# Patient Record
Sex: Male | Born: 1965 | Race: White | Hispanic: No | Marital: Married | Smoking: Never smoker
Health system: Southern US, Community
[De-identification: ages and names within clinical notes are randomized; demographics above are authoritative.]

## PROBLEM LIST (undated history)

## (undated) DIAGNOSIS — S42302A Unspecified fracture of shaft of humerus, left arm, initial encounter for closed fracture: Secondary | ICD-10-CM

## (undated) DIAGNOSIS — R569 Unspecified convulsions: Secondary | ICD-10-CM

## (undated) DIAGNOSIS — Z87898 Personal history of other specified conditions: Secondary | ICD-10-CM

## (undated) DIAGNOSIS — M5126 Other intervertebral disc displacement, lumbar region: Secondary | ICD-10-CM

## (undated) DIAGNOSIS — C801 Malignant (primary) neoplasm, unspecified: Secondary | ICD-10-CM

## (undated) DIAGNOSIS — R51 Headache: Secondary | ICD-10-CM

## (undated) DIAGNOSIS — G2581 Restless legs syndrome: Secondary | ICD-10-CM

## (undated) DIAGNOSIS — I10 Essential (primary) hypertension: Secondary | ICD-10-CM

## (undated) DIAGNOSIS — Z87442 Personal history of urinary calculi: Secondary | ICD-10-CM

## (undated) HISTORY — DX: Essential (primary) hypertension: I10

## (undated) HISTORY — DX: Unspecified convulsions: R56.9

## (undated) HISTORY — DX: Personal history of urinary calculi: Z87.442

## (undated) HISTORY — DX: Personal history of other specified conditions: Z87.898

## (undated) HISTORY — DX: Headache: R51

## (undated) HISTORY — DX: Restless legs syndrome: G25.81

## (undated) HISTORY — PX: SHOULDER SURGERY: SHX246

## (undated) HISTORY — DX: Other intervertebral disc displacement, lumbar region: M51.26

## (undated) HISTORY — PX: FRACTURE SURGERY: SHX138

## (undated) HISTORY — DX: Unspecified fracture of shaft of humerus, left arm, initial encounter for closed fracture: S42.302A

## (undated) HISTORY — DX: Malignant (primary) neoplasm, unspecified: C80.1

---

## 1988-06-24 DIAGNOSIS — C801 Malignant (primary) neoplasm, unspecified: Secondary | ICD-10-CM

## 1988-06-24 HISTORY — DX: Malignant (primary) neoplasm, unspecified: C80.1

## 2004-04-10 ENCOUNTER — Ambulatory Visit: Payer: Self-pay

## 2004-04-10 ENCOUNTER — Ambulatory Visit: Payer: Self-pay | Admitting: Cardiology

## 2004-05-08 ENCOUNTER — Ambulatory Visit: Payer: Self-pay | Admitting: Cardiology

## 2011-11-11 DIAGNOSIS — I10 Essential (primary) hypertension: Secondary | ICD-10-CM | POA: Insufficient documentation

## 2011-11-11 DIAGNOSIS — G40309 Generalized idiopathic epilepsy and epileptic syndromes, not intractable, without status epilepticus: Secondary | ICD-10-CM | POA: Insufficient documentation

## 2011-11-11 DIAGNOSIS — G43019 Migraine without aura, intractable, without status migrainosus: Secondary | ICD-10-CM | POA: Insufficient documentation

## 2011-11-11 DIAGNOSIS — Z85820 Personal history of malignant melanoma of skin: Secondary | ICD-10-CM | POA: Insufficient documentation

## 2011-11-11 DIAGNOSIS — Z5181 Encounter for therapeutic drug level monitoring: Secondary | ICD-10-CM | POA: Insufficient documentation

## 2012-04-05 ENCOUNTER — Ambulatory Visit: Payer: Self-pay | Admitting: Family Medicine

## 2012-04-05 VITALS — BP 135/92 | HR 57 | Temp 98.1°F | Resp 16 | Ht 65.5 in | Wt 172.4 lb

## 2012-04-05 DIAGNOSIS — Z0289 Encounter for other administrative examinations: Secondary | ICD-10-CM
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
## 2012-09-20 ENCOUNTER — Telehealth: Payer: Self-pay

## 2012-09-20 MED ORDER — PHENYTOIN SODIUM EXTENDED 100 MG PO CAPS: 300.0000 mg | ORAL_CAPSULE | Freq: Every evening | ORAL | Status: DC

## 2012-09-20 NOTE — Telephone Encounter (Signed)
Spouse called and left message sayinginsurance has changed and they need rx sent to pharmacy.  She left no pharmacy name or medication name.  I called back.  Spoke with spouse.  She said she wants generic medication sent to [facility] with her ID # [id number].  Says she is the primary card holder and the name is under [employer].  Although we have documented that we have sent the previous 3 rx's for generic, she says the pharmacy sent Brand Name last time and they do not want brand name.  States she wants this rx sent now.  Advised her I am sending Rx.

## 2012-12-09 ENCOUNTER — Encounter (HOSPITAL_COMMUNITY): Payer: Self-pay | Admitting: Emergency Medicine

## 2012-12-09 ENCOUNTER — Emergency Department (HOSPITAL_COMMUNITY): Payer: 59

## 2012-12-09 ENCOUNTER — Inpatient Hospital Stay (HOSPITAL_COMMUNITY)
Admission: EM | Admit: 2012-12-09 | Discharge: 2012-12-11 | DRG: 101 | Disposition: A | Discharge status: Home / Self Care | Payer: 59 | Attending: Internal Medicine | Admitting: Internal Medicine

## 2012-12-09 DIAGNOSIS — G40909 Epilepsy, unspecified, not intractable, without status epilepticus: Principal | ICD-10-CM | POA: Diagnosis present

## 2012-12-09 DIAGNOSIS — R55 Syncope and collapse: Secondary | ICD-10-CM | POA: Diagnosis present

## 2012-12-09 DIAGNOSIS — R569 Unspecified convulsions: Secondary | ICD-10-CM | POA: Diagnosis present

## 2012-12-09 DIAGNOSIS — I10 Essential (primary) hypertension: Secondary | ICD-10-CM | POA: Diagnosis present

## 2012-12-09 LAB — CBC WITH DIFFERENTIAL/PLATELET
Basophils Absolute: 0 10*3/uL (ref 0.0–0.1)
Basophils Relative: 0 % (ref 0–1)
Eosinophils Absolute: 0.1 10*3/uL (ref 0.0–0.7)
HCT: 35.3 % — ABNORMAL LOW (ref 39.0–52.0)
Hemoglobin: 12.5 g/dL — ABNORMAL LOW (ref 13.0–17.0)
MCH: 29.8 pg (ref 26.0–34.0)
MCHC: 35.4 g/dL (ref 30.0–36.0)
Monocytes Absolute: 0.7 10*3/uL (ref 0.1–1.0)
Monocytes Relative: 7 % (ref 3–12)
RDW: 12.8 % (ref 11.5–15.5)

## 2012-12-09 LAB — URINALYSIS, ROUTINE W REFLEX MICROSCOPIC
Glucose, UA: 100 mg/dL — AB
Hgb urine dipstick: NEGATIVE
Leukocytes, UA: NEGATIVE
Specific Gravity, Urine: 1.013 (ref 1.005–1.030)
pH: 6 (ref 5.0–8.0)

## 2012-12-09 LAB — PHENYTOIN LEVEL, TOTAL: Phenytoin Lvl: <2.5 ug/mL — ABNORMAL LOW (ref 10.0–20.0)

## 2012-12-09 LAB — TROPONIN I
Troponin I: <0.3 ng/mL (ref <0.30)
Troponin I: <0.3 ng/mL (ref <0.30)

## 2012-12-09 LAB — MRSA PCR SCREENING: MRSA by PCR: NEGATIVE

## 2012-12-09 LAB — POCT I-STAT TROPONIN I: Troponin i, poc: 0.01 ng/mL (ref 0.00–0.08)

## 2012-12-09 MED ORDER — ONDANSETRON HCL 4 MG/2ML IJ SOLN
4.0000 mg | Freq: Four times a day (QID) | INTRAMUSCULAR | Status: DC | PRN
  Administered 2012-12-09 – 2012-12-10 (×3): 4 mg via INTRAVENOUS
  Filled 2012-12-09 (×5): qty 2

## 2012-12-09 MED ORDER — LORAZEPAM 2 MG/ML IJ SOLN
INTRAMUSCULAR | Status: AC
  Administered 2012-12-09: 11:00:00
  Filled 2012-12-09: qty 1

## 2012-12-09 MED ORDER — SODIUM CHLORIDE 0.9 % IV SOLN
1000.0000 mg | Freq: Once | INTRAVENOUS | Status: AC
  Administered 2012-12-09: 1000 mg via INTRAVENOUS
  Filled 2012-12-09: qty 20

## 2012-12-09 MED ORDER — MORPHINE SULFATE 4 MG/ML IJ SOLN
4.0000 mg | Freq: Once | INTRAMUSCULAR | Status: AC
  Administered 2012-12-09: 4 mg via INTRAVENOUS
  Filled 2012-12-09: qty 1

## 2012-12-09 MED ORDER — HYDROMORPHONE HCL PF 1 MG/ML IJ SOLN
1.0000 mg | INTRAMUSCULAR | Status: DC | PRN
  Administered 2012-12-09 – 2012-12-10 (×2): 1 mg via INTRAVENOUS
  Filled 2012-12-09 (×2): qty 1

## 2012-12-09 MED ORDER — ONDANSETRON HCL 4 MG/2ML IJ SOLN
4.0000 mg | Freq: Three times a day (TID) | INTRAMUSCULAR | Status: DC | PRN
  Administered 2012-12-09: 4 mg via INTRAVENOUS
  Filled 2012-12-09: qty 2

## 2012-12-09 MED ORDER — ENOXAPARIN SODIUM 40 MG/0.4ML ~~LOC~~ SOLN
40.0000 mg | Freq: Every morning | SUBCUTANEOUS | Status: DC
  Administered 2012-12-10 – 2012-12-11 (×2): 40 mg via SUBCUTANEOUS
  Filled 2012-12-09 (×3): qty 0.4

## 2012-12-09 MED ORDER — SODIUM CHLORIDE 0.9 % IJ SOLN
3.0000 mL | Freq: Two times a day (BID) | INTRAMUSCULAR | Status: DC
  Administered 2012-12-10 – 2012-12-11 (×2): 3 mL via INTRAVENOUS

## 2012-12-09 MED ORDER — SODIUM CHLORIDE 0.9 % IV SOLN: INTRAVENOUS | Status: DC

## 2012-12-09 MED ORDER — SODIUM CHLORIDE 0.9 % IV SOLN
INTRAVENOUS | Status: DC
  Administered 2012-12-09: 11:00:00 via INTRAVENOUS

## 2012-12-09 MED ORDER — ONDANSETRON HCL 4 MG/2ML IJ SOLN
4.0000 mg | Freq: Once | INTRAMUSCULAR | Status: AC
  Administered 2012-12-09: 4 mg via INTRAVENOUS
  Filled 2012-12-09: qty 2

## 2012-12-09 MED ORDER — SODIUM CHLORIDE 0.9 % IV BOLUS (SEPSIS)
1000.0000 mL | Freq: Once | INTRAVENOUS | Status: AC
  Administered 2012-12-09: 1000 mL via INTRAVENOUS

## 2012-12-09 MED ORDER — BIOTENE DRY MOUTH MT LIQD
15.0000 mL | Freq: Two times a day (BID) | OROMUCOSAL | Status: DC
  Administered 2012-12-09 – 2012-12-11 (×4): 15 mL via OROMUCOSAL

## 2012-12-09 MED ORDER — ACETAMINOPHEN 325 MG PO TABS
650.0000 mg | ORAL_TABLET | Freq: Four times a day (QID) | ORAL | Status: DC | PRN
  Administered 2012-12-09 – 2012-12-11 (×3): 650 mg via ORAL
  Filled 2012-12-09 (×4): qty 2

## 2012-12-09 MED ORDER — CHLORHEXIDINE GLUCONATE 0.12 % MT SOLN
15.0000 mL | Freq: Two times a day (BID) | OROMUCOSAL | Status: DC
  Administered 2012-12-11: 15 mL via OROMUCOSAL
  Filled 2012-12-09 (×6): qty 15

## 2012-12-09 MED ORDER — HYDROMORPHONE HCL PF 1 MG/ML IJ SOLN
1.0000 mg | INTRAMUSCULAR | Status: DC | PRN
  Administered 2012-12-09: 1 mg via INTRAVENOUS
  Filled 2012-12-09: qty 1

## 2012-12-09 MED ORDER — SODIUM CHLORIDE 0.9 % IV SOLN
INTRAVENOUS | Status: DC
  Administered 2012-12-10: 75 mL/h via INTRAVENOUS

## 2012-12-09 MED ORDER — LORAZEPAM 2 MG/ML IJ SOLN
2.0000 mg | Freq: Once | INTRAMUSCULAR | Status: AC
  Administered 2012-12-09: 2 mg via INTRAVENOUS

## 2012-12-09 MED ORDER — MAGIC MOUTHWASH W/LIDOCAINE
5.0000 mL | Freq: Four times a day (QID) | ORAL | Status: DC | PRN
  Administered 2012-12-09: 5 mL via ORAL
  Filled 2012-12-09: qty 5

## 2012-12-09 NOTE — ED Provider Notes (Signed)
History    CSN: [id number] Arrival date & time 12/09/12  4098  First MD Initiated Contact with Patient 12/09/12 [phone]     No chief complaint on file.  (Consider location/radiation/quality/duration/timing/severity/associated sxs/prior Treatment) HPI  47 year old male with prior history of seizure, remote hx of melanoma of the skin presents for evaluations of syncope.  History obtained through wife who is at bedside, through pt and through paramedic.  Patient reports this morning when he was cleaning out his microwave he had an apparent syncopal episode.  His wife heard a "thud" then immediately saw pt laying on the ground, lips are blue, unresponsive with widen pupils, no convulsion.  She was unable to arouse him initially, proceed to perform chest compression for several minutes before he became responsive.  Pt was confused afterward.  Pt currently c/o of chest pain and L arm pain.  Pain is sharp, worsening with movement or with taking deep breath.  Reports nausea without vomiting.  No CP prior to fall.  No precipitating sxs that pt can recall. However, he did report forgetting to take his regular medication yesterday (which is unusual for him) and did not have time to eat dinner last night. Pt works 2 different jobs. Did report feeling tired this AM.  Pt has not had a seizure for nearly 20 years.  Has been taking dilantin regularly.  No recent sickness.  No c/o fever, headache, vision changes, dizziness, lightheadedness, tongue biting, sob, abd pain, back pain, urinary/bowel incontinence, numbness or rash.  Not on blood thinner. Aside from high BP, no significant cardiac disease.  Non smoker, no family hx of premature cardiac death.  Past Medical History  Diagnosis Date  . Cancer february 1990    melanoma stage 3 left shoulder blade  . Seizures    Past Surgical History  Procedure Laterality Date  . Fracture surgery     No family history on file. History  Substance Use Topics  . Smoking  status: Never Smoker   . Smokeless tobacco: Not on file  . Alcohol Use: Yes     Comment: 2 beers year    Review of Systems  All other systems reviewed and are negative.    Allergies  Review of patient's allergies indicates no known allergies.  Home Medications   Current Outpatient Rx  Name  Route  Sig  Dispense  Refill  . bisoprolol-hydrochlorothiazide (ZIAC) 10-6.25 MG per tablet   Oral   Take 1 tablet by mouth daily.         . Calcium Carbonate-Vitamin D (CALCIUM + D PO)   Oral   Take 1 tablet by mouth daily.         Marland Kitchen dexlansoprazole (DEXILANT) 60 MG capsule   Oral   Take 60 mg by mouth daily.         . Multiple Vitamins-Minerals (MULTIVITAMIN PO)   Oral   Take 1 tablet by mouth daily.         . phenytoin (DILANTIN) 100 MG ER capsule   Oral   Take 3 capsules (300 mg total) by mouth every evening.   270 capsule   0     Please Schedule Appt (Primary Cardholder Name Yisroel Ramming ...    There were no vitals taken for this visit. Physical Exam  Nursing note and vitals reviewed. Constitutional: He is oriented to person, place, and time. He appears well-developed and well-nourished. No distress (drowsy but able to answer all questions appropriately).  Awake, alert, nontoxic appearance  HENT:  Head: Normocephalic and atraumatic.  No scalp tenderness  No tongue injury.  An ulcer noted to lateral aspect of tongue  Eyes: Conjunctivae and EOM are normal. Pupils are equal, round, and reactive to light. Right eye exhibits no discharge. Left eye exhibits no discharge.  Neck: Normal range of motion. Neck supple.  Cardiovascular: Normal rate and regular rhythm.   Pulmonary/Chest: Effort normal. No respiratory distress. He exhibits no tenderness (moderate tenderness to L upper chest wall on palpation without crepitus, flail chest, or bruising).  Abdominal: Soft. There is no tenderness. There is no rebound.  Musculoskeletal: He exhibits no edema and no tenderness.  ROM  appears intact, no obvious focal weakness  Neurological: He is alert and oriented to person, place, and time. He has normal strength. No cranial nerve deficit or sensory deficit. Coordination normal. GCS eye subscore is 4. GCS verbal subscore is 5. GCS motor subscore is 6.  Skin: Skin is warm and dry. No rash noted.  Psychiatric: He has a normal mood and affect.    ED Course  Procedures (including critical care time)   Date: 12/09/2012  Rate: 78  Rhythm: normal sinus rhythm  QRS Axis: normal  Intervals: normal  ST/T Wave abnormalities: normal  Conduction Disutrbances: none  Narrative Interpretation:   Old EKG Reviewed: none for comparison     Pt here for evaluation of syncope vs. Seizure.  Work up initiated.  Care discussed with attending.  10:46 AM Pt had a witnessed tonic/clonic seizure lasting <19min while in ER.  Pt was given ativan 2mg  intravenously.  Pt with given bag valve mask, yaunker suction and chin thrush to protect his airway. Seizure precaution implemented. He was monitor closely for the next 40 min.  WIll load with Dilantin 1g IV.  CUrrently checking dilantin level.  Will consult neuro.  Attending participate in care.    11:18 AM Dilantin level subtherapeutic.  WIll continue with Dilantin loading dose, will have pt admitted for further care.  Pt did bite his tongue after 2nd seizure.  No deep laceration requiring sutures.  Pt is still postictal, but protecting airway.  I have consulted with neurologist, Dr. Leroy Kennedy who will see pt in ER.  Will call for admission for further management.    12:08 PM I have consulted with [facility], Dr. Elvera Lennox, who agrees to have pt admit to step down, team 10, under his care.  Pt and family members are aware.    CRITICAL CARE Performed by: Fayrene Helper Total critical care time: 40 min Critical care time was exclusive of separately billable procedures and treating other patients. Critical care was necessary to treat or prevent  imminent or life-threatening deterioration. Critical care was time spent personally by me on the following activities: development of treatment plan with patient and/or surrogate as well as nursing, discussions with consultants, evaluation of patient's response to treatment, examination of patient, obtaining history from patient or surrogate, ordering and performing treatments and interventions, ordering and review of laboratory studies, ordering and review of radiographic studies, pulse oximetry and re-evaluation of patient's condition.     Labs Reviewed  CBC WITH DIFFERENTIAL - Abnormal; Notable for the following:    WBC 11.2 (*)    RBC 4.20 (*)    Hemoglobin 12.5 (*)    HCT 35.3 (*)    Neutrophils Relative % 84 (*)    Neutro Abs 9.4 (*)    Lymphocytes Relative 8 (*)    All other components within normal limits  BASIC  METABOLIC PANEL - Abnormal; Notable for the following:    Potassium 3.2 (*)    Glucose, Bld 127 (*)    All other components within normal limits  PHENYTOIN LEVEL, TOTAL - Abnormal; Notable for the following:    Phenytoin Lvl <2.5 (*)    All other components within normal limits  URINALYSIS, ROUTINE W REFLEX MICROSCOPIC  POCT I-STAT TROPONIN I   Dg Chest 2 View  12/09/2012   *RADIOLOGY REPORT*  Clinical Data: Fall, rib and chest pain  CHEST - 2 VIEW  Comparison: CT abdomen/pelvis 12/20/2010  Findings: Slightly low inspiratory volumes.  No pneumothorax, pleural effusion, pulmonary edema or focal airspace consolidation. Cardiac and mediastinal contours are within normal limits.  No acute fracture identified.  IMPRESSION: No acute cardiopulmonary disease.   Original Report Authenticated By: Malachy Moan, M.D.   Ct Head Wo Contrast  12/09/2012   *RADIOLOGY REPORT*  Clinical Data: Fall, syncope  CT HEAD WITHOUT CONTRAST  Technique:  Contiguous axial images were obtained from the base of the skull through the vertex without contrast.  Comparison: None.  Findings: No skull  fracture.  No hemorrhage, infarct, mass, or hydrocephalus.  Normal sulcation.  IMPRESSION: Normal head CT   Original Report Authenticated By: Esperanza Heir, M.D.   1. Seizure     MDM  BP 125/68  Pulse 104  Temp(Src) 98 F (36.7 C) (Oral)  Resp 17  SpO2 99%  I have reviewed nursing notes and vital signs. I personally reviewed the imaging tests through PACS system  I reviewed available ER/hospitalization records thought the EMR   Fayrene Helper, [place] 12/09/12 1212

## 2012-12-09 NOTE — H&P (Signed)
[facility] History and Physical  Seth Liu NFA:[id number] DOB: 1965/10/16 DOA: 12/09/2012  Referring physician: Dr. Jodi Mourning PCP: No primary provider on file.  Specialists: Neurology, Dr. Cyril Mourning  Chief Complaint: seizure/syncope  HPI: Seth Liu is a 47 y.o. male has a past medical history significant for seizure disorder on Dilantin, comes in after an episode of unresponsiveness at home this morning. His wife who is bedside reports that around 6:30 this morning she heard a "thump" and she saw him unresponsive, on the ground, appeared not to breath, blue lips and wide pupils. She called 911 and started CPR; patient became responsive in few minutes and was confused afterwards. In the ED, patient had a seizure episode which was "completely different" - per wife - that the episode at home. Patient is confused in the ED but can tell me that he missed one dose of his Dilantin yesterday. He does not recall and wife is unaware of any symptoms in the prior hours or days such as chest pain, SOB, lightheadedness or dizziness, abdominal pain, nausea, vomiting, fevers or chills. Patient's last seizure episode was in 1999. His medical history also includes HTN which is controlled with medications.  Review of Systems: could not obtain full ROS due to AMS  Past Medical History  Diagnosis Date  . Cancer february 1990    melanoma stage 3 left shoulder blade  . Seizures    Past Surgical History  Procedure Laterality Date  . Fracture surgery     Social History:  reports that he has never smoked. He does not have any smokeless tobacco history on file. He reports that  drinks alcohol. His drug history is not on file.  No Known Allergies  History reviewed. No pertinent family history.  Prior to Admission medications   Medication Sig Start Date End Date Taking? Authorizing Provider  bisoprolol-hydrochlorothiazide (ZIAC) 10-6.25 MG per tablet Take 1 tablet by mouth daily.   Yes Historical Provider,  MD  Calcium Carbonate-Vitamin D (CALCIUM + D PO) Take 1 tablet by mouth daily.   Yes Historical Provider, MD  dexlansoprazole (DEXILANT) 60 MG capsule Take 60 mg by mouth daily as needed (Acid reflux).    Yes Historical Provider, MD  lisinopril (PRINIVIL,ZESTRIL) 20 MG tablet Take 20 mg by mouth daily.   Yes Historical Provider, MD  Multiple Vitamins-Minerals (MULTIVITAMIN PO) Take 1 tablet by mouth daily.   Yes Historical Provider, MD  phenytoin (DILANTIN) 100 MG ER capsule Take 3 capsules (300 mg total) by mouth every evening. 09/20/12  Yes York Spaniel, MD   Physical Exam: Filed Vitals:   12/09/12 1200 12/09/12 1215 12/09/12 1230 12/09/12 1245  BP: 115/99 110/67 96/52 108/63  Pulse: 91 81 87 90  Temp:      TempSrc:      Resp: 16 11 13 21   SpO2: 100% 100% 100% 100%     General:  Confused, able to answer some questions  Eyes: pupils reactive to light, no scleral icterus  ENT: moist oropharynx, bite marks on his tongue  Neck: supple, no JVD  Cardiovascular: regular rate without MRG; 2+ peripheral pulses  Respiratory: CTA biL, good air movement without wheezing, rhonchi or crackled  Abdomen: soft, non tender to palpation, positive bowel sounds, no guarding, no rebound  Skin: no rashes  Musculoskeletal: no peripheral edema  Neurologic: no focal findings  Labs on Admission:  Basic Metabolic Panel:  Recent Labs Lab 12/09/12 0945  NA 138  K 3.2*  CL 107  CO2 23  GLUCOSE 127*  BUN 8  CREATININE 0.77  CALCIUM 8.6   CBC:  Recent Labs Lab 12/09/12 0945  WBC 11.2*  NEUTROABS 9.4*  HGB 12.5*  HCT 35.3*  MCV 84.0  PLT 215   Radiological Exams on Admission: Dg Chest 2 View  12/09/2012   *RADIOLOGY REPORT*  Clinical Data: Fall, rib and chest pain  CHEST - 2 VIEW  Comparison: CT abdomen/pelvis 12/20/2010  Findings: Slightly low inspiratory volumes.  No pneumothorax, pleural effusion, pulmonary edema or focal airspace consolidation. Cardiac and mediastinal  contours are within normal limits.  No acute fracture identified.  IMPRESSION: No acute cardiopulmonary disease.   Original Report Authenticated By: Malachy Moan, M.D.   Ct Head Wo Contrast  12/09/2012   *RADIOLOGY REPORT*  Clinical Data: Fall, syncope  CT HEAD WITHOUT CONTRAST  Technique:  Contiguous axial images were obtained from the base of the skull through the vertex without contrast.  Comparison: None.  Findings: No skull fracture.  No hemorrhage, infarct, mass, or hydrocephalus.  Normal sulcation.  IMPRESSION: Normal head CT   Original Report Authenticated By: Esperanza Heir, M.D.   Assessment/Plan Active Problems:   Syncope   Seizure   Essential hypertension, benign  Syncope/seizure - I suspect that the episode at home might have been a seizure as it was un witnessed but after patient was down on the floor but could not exclude true syncope. Telemetry, cardiac enzymes x 3, 2D echo. He did have a true seizure in the ED. Neurology consulted by the EDP, appreciate their input. Given 2 back to back episodes and post ictal state in the ED, observe in SDU for now. NPO. Seizure precautions.  - CT scan negative in the ED. HTN - holding home medications restart as indicated.  DVT Prophylaxis - Lovenox s.q.  Code Status: Presumed full  Family Communication: wife bedside  Disposition Plan: inpatient  Time spent: 55  Rhea Kaelin M. Elvera Lennox, MD [facility] Pager [phone]  If 7PM-7AM, please contact night-coverage [email or web address] Password [email or web address] 12/09/2012, 1:33 PM

## 2012-12-09 NOTE — ED Notes (Signed)
Patient brought in via [place] after a seizure this morning. Patient has history of seizures last was in 1999. Patient wife advised that patient missed taking his seizure medication yesterday. Wife advised patient feel to floor and was unresponsive and not breathing lips were purple. Wife started chest compressions and contacted EMS. [place] advised upon arrival patient was confused and slightly combative, CBG 123 at the scene. IV 18 gauge in the Left AC. Patient at this time is alert and oriented times 4, complains of nausea and slight headache.

## 2012-12-09 NOTE — ED Provider Notes (Signed)
Medical screening examination/treatment/procedure(s) were conducted as a shared visit with non-physician practitioner(s) or resident  and myself.  I personally evaluated the patient during the encounter and agree with the findings and plan unless otherwise indicated. Recurrent seizures with hx of seizures in the past.  Concern for cyanosis and sudden onset for also possible cardiac.  Pt had recurrent seizure in ED requiring emergent attention, ativan and close monitoring.  Pt improved on multiple rechecks.  Loaded with Fosphen in ED.  Admitted to step down.  CRITICAL CARE Performed by: Enid Skeens   Total critical care time: 40 min  Critical care time was exclusive of separately billable procedures and treating other patients.  Critical care was necessary to treat or prevent imminent or life-threatening deterioration.  Critical care was time spent personally by me on the following activities: development of treatment plan with patient and/or surrogate as well as nursing, discussions with consultants, evaluation of patient's response to treatment, examination of patient, obtaining history from patient or surrogate, ordering and performing treatments and interventions, ordering and review of laboratory studies, ordering and review of radiographic studies, pulse oximetry and re-evaluation of patient's condition.   Enid Skeens, MD 12/09/12 [phone]

## 2012-12-09 NOTE — ED Notes (Addendum)
Pt wife requesting foley catheter because pt is un able to void on own. Pt unable to stand at this time. Seth Liu [facility] informed of Wife's request.

## 2012-12-09 NOTE — Progress Notes (Signed)
rec'd patient from ED via stretcher. Patient able to move himself to the bed. CHG bath done. Oriented to room, call bell and bed controls. Will continue to monitor. Kellie Shropshire, RN

## 2012-12-09 NOTE — ED Notes (Signed)
Back from xray

## 2012-12-09 NOTE — Consult Note (Signed)
NEURO HOSPITALIST CONSULT NOTE    Reason for Consult:seizures  HPI:                                                                                                                                          Seth Liu is an 47 y.o. 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 Had had 2-3 seizures total in life. He stated that he has been seizure free on dilantin since 1996 and takes his dilantin faithfully. As a matter of fact, he tells me that his last seizure occurred when his neurologist attempted to wean him off dilantin. However, he acknowledged no taking his dilantin yesterday and said that he is under lot of stress and sleep deprived. In any case, he was at home today, finished taking a shower, then went to the kitchen and was washing some dishes when suddenly collapsed to the floor unconscious and was found on the floor by his wife " unresponsive with wide pupils, blue lips, but no convulsion". Denies any premonitory symptoms before falling to the floor. He was brought to [facility] ED where he had a witnessed GTC seizure lasting for about 1 minute, and afterwards he was confused and amnestic for the event. Received IV ativan. Dilantin level <2.5 and was loaded with 1 gram IV dilantin. No further seizures and he seems to be back to baseline now. CT brain revealed no acute abnormality. No recent fever or infection. Stated that his prior seizure work up consisted of unremarkable MRI brain and EEG that showed " some spikes". No family history of epilepsy, CNS infection, stroke, or severe head injury. Now he is complaining of mild headache but denies vertigo, double vision, difficulty swallowing, focal weakness or numbness, slurred speech, language or vision impairment.   Past Medical History  Diagnosis Date  . Cancer february 1990   melanoma stage 3 left shoulder blade  . Seizures     Past Surgical History  Procedure Laterality Date  . Fracture surgery      History reviewed. No pertinent family history.  Family History:no epilepsy   Social History:  reports that he has never smoked. He does not have any smokeless tobacco history on file. He reports that  drinks alcohol. His drug history is not on file.  No Known Allergies  MEDICATIONS:  I have reviewed the patient's current medications.   ROS:                                                                                                                                       History obtained from the patient and chart review.  General ROS: negative for - chills, fatigue, fever, night sweats, weight gain or weight loss Psychological ROS: negative for - behavioral disorder, hallucinations, memory difficulties, mood swings or suicidal ideation Ophthalmic ROS: negative for - blurry vision, double vision, eye pain or loss of vision ENT ROS: negative for - epistaxis, nasal discharge, oral lesions, sore throat, tinnitus or vertigo Allergy and Immunology ROS: negative for - hives or itchy/watery eyes Hematological and Lymphatic ROS: negative for - bleeding problems, bruising or swollen lymph nodes Endocrine ROS: negative for - galactorrhea, hair pattern changes, polydipsia/polyuria or temperature intolerance Respiratory ROS: negative for - cough, hemoptysis, shortness of breath or wheezing Cardiovascular ROS: negative for - chest pain, dyspnea on exertion, edema or irregular heartbeat Gastrointestinal ROS: negative for - abdominal pain, diarrhea, hematemesis, nausea/vomiting or stool incontinence Genito-Urinary ROS: negative for - dysuria, hematuria, incontinence or urinary frequency/urgency Musculoskeletal ROS: negative for - joint swelling or muscular  weakness Neurological ROS: as noted in HPI Dermatological ROS: negative for rash and skin lesion changes    Physical exam: pleasant male in no apparent distress. Blood pressure 108/63, pulse 90, temperature 98 F (36.7 C), temperature source Oral, resp. rate 21, SpO2 100.00%. Head: normocephalic. Neck: supple, no bruits, no JVD. Cardiac: no murmurs. Lungs: clear. Abdomen: soft, no tender, no mass. Extremities: no edema.     Neurologic Examination:                                                                                                      Mental Status: Alert, oriented, thought content appropriate.  Speech fluent without evidence of aphasia.  Able to follow 3 step commands without difficulty. Cranial Nerves: II: Discs flat bilaterally; Visual fields grossly normal, pupils equal, round, reactive to light and accommodation III,IV, VI: ptosis not present, extra-ocular motions intact bilaterally V,VII: smile symmetric, facial light touch sensation normal bilaterally VIII: hearing normal bilaterally IX,X: gag reflex present XI: bilateral shoulder shrug XII: midline tongue extension Motor: Right : Upper extremity   5/5    Left:     Upper extremity   5/5  Lower extremity   5/5     Lower extremity   5/5 Tone and bulk:normal tone  throughout; no atrophy noted Sensory: Pinprick and light touch intact throughout, bilaterally Deep Tendon Reflexes:  Right: Upper Extremity   Left: Upper extremity   biceps (C-5 to C-6) 2/4   biceps (C-5 to C-6) 2/4 tricep (C7) 2/4    triceps (C7) 2/4 Brachioradialis (C6) 2/4  Brachioradialis (C6) 2/4  Lower Extremity Lower Extremity  quadriceps (L-2 to L-4) 2/4   quadriceps (L-2 to L-4) 2/4 Achilles (S1) 2/4   Achilles (S1) 2/4  Plantars: Right: downgoing   Left: downgoing Cerebellar: normal finger-to-nose,  normal heel-to-shin test Gait:  No tested CV: pulses palpable throughout    No results found for this basename: cbc, bmp, coags,  chol, tri, ldl, hga1c    Results for orders placed during the hospital encounter of 12/09/12 (from the past 48 hour(s))  CBC WITH DIFFERENTIAL     Status: Abnormal   Collection Time    12/09/12  9:45 AM      Result Value Range   WBC 11.2 (*) 4.0 - 10.5 K/uL   RBC 4.20 (*) 4.22 - 5.81 MIL/uL   Hemoglobin 12.5 (*) 13.0 - 17.0 g/dL   HCT 16.1 (*) 09.6 - 04.5 %   MCV 84.0  78.0 - 100.0 fL   MCH 29.8  26.0 - 34.0 pg   MCHC 35.4  30.0 - 36.0 g/dL   RDW 40.9  81.1 - 91.4 %   Platelets 215  150 - 400 K/uL   Neutrophils Relative % 84 (*) 43 - 77 %   Neutro Abs 9.4 (*) 1.7 - 7.7 K/uL   Lymphocytes Relative 8 (*) 12 - 46 %   Lymphs Abs 0.9  0.7 - 4.0 K/uL   Monocytes Relative 7  3 - 12 %   Monocytes Absolute 0.7  0.1 - 1.0 K/uL   Eosinophils Relative 1  0 - 5 %   Eosinophils Absolute 0.1  0.0 - 0.7 K/uL   Basophils Relative 0  0 - 1 %   Basophils Absolute 0.0  0.0 - 0.1 K/uL  BASIC METABOLIC PANEL     Status: Abnormal   Collection Time    12/09/12  9:45 AM      Result Value Range   Sodium 138  135 - 145 mEq/L   Potassium 3.2 (*) 3.5 - 5.1 mEq/L   Chloride 107  96 - 112 mEq/L   CO2 23  19 - 32 mEq/L   Glucose, Bld 127 (*) 70 - 99 mg/dL   BUN 8  6 - 23 mg/dL   Creatinine, Ser 7.82  0.50 - 1.35 mg/dL   Calcium 8.6  8.4 - 95.6 mg/dL   GFR calc non Af Amer >90  >90 mL/min   GFR calc Af Amer >90  >90 mL/min   Comment:            The eGFR has been calculated     using the CKD EPI equation.     This calculation has not been     validated in all clinical     situations.     eGFR's persistently     <90 mL/min signify     possible Chronic Kidney Disease.  PHENYTOIN LEVEL, TOTAL     Status: Abnormal   Collection Time    12/09/12  9:45 AM      Result Value Range   Phenytoin Lvl <2.5 (*) 10.0 - 20.0 ug/mL  POCT I-STAT TROPONIN I     Status: None   Collection Time  12/09/12  9:47 AM      Result Value Range   Troponin i, poc 0.01  0.00 - 0.08 ng/mL   Comment 3            Comment:  Due to the release kinetics of cTnI,     a negative result within the first hours     of the onset of symptoms does not rule out     myocardial infarction with certainty.     If myocardial infarction is still suspected,     repeat the test at appropriate intervals.  URINALYSIS, ROUTINE W REFLEX MICROSCOPIC     Status: Abnormal   Collection Time    12/09/12 10:28 AM      Result Value Range   Color, Urine YELLOW  YELLOW   APPearance CLEAR  CLEAR   Specific Gravity, Urine 1.013  1.005 - 1.030   pH 6.0  5.0 - 8.0   Glucose, UA 100 (*) NEGATIVE mg/dL   Hgb urine dipstick NEGATIVE  NEGATIVE   Bilirubin Urine NEGATIVE  NEGATIVE   Ketones, ur NEGATIVE  NEGATIVE mg/dL   Protein, ur NEGATIVE  NEGATIVE mg/dL   Urobilinogen, UA 0.2  0.0 - 1.0 mg/dL   Nitrite NEGATIVE  NEGATIVE   Leukocytes, UA NEGATIVE  NEGATIVE   Comment: MICROSCOPIC NOT DONE ON URINES WITH NEGATIVE PROTEIN, BLOOD, LEUKOCYTES, NITRITE, OR GLUCOSE <1000 mg/dL.    Dg Chest 2 View  12/09/2012   *RADIOLOGY REPORT*  Clinical Data: Fall, rib and chest pain  CHEST - 2 VIEW  Comparison: CT abdomen/pelvis 12/20/2010  Findings: Slightly low inspiratory volumes.  No pneumothorax, pleural effusion, pulmonary edema or focal airspace consolidation. Cardiac and mediastinal contours are within normal limits.  No acute fracture identified.  IMPRESSION: No acute cardiopulmonary disease.   Original Report Authenticated By: Malachy Moan, M.D.   Ct Head Wo Contrast  12/09/2012   *RADIOLOGY REPORT*  Clinical Data: Fall, syncope  CT HEAD WITHOUT CONTRAST  Technique:  Contiguous axial images were obtained from the base of the skull through the vertex without contrast.  Comparison: None.  Findings: No skull fracture.  No hemorrhage, infarct, mass, or hydrocephalus.  Normal sulcation.  IMPRESSION: Normal head CT   Original Report Authenticated By: Esperanza Heir, M.D.     Assessment/Plan: 47 y/o male with a history of GTC seizures, admitted after  sustaining a seizure earlier today. Sub-therapeutic phenytoin level and he admits that he did not take his dilantin yesterday. Sleep deprived, stress at work. Non focal exam. I think this seizure most likely related to sub therapeutic phenytoin level. Neuro-exam is non focal but will suggest MRI brain and EEG to better address his seizure syndrome. Check dilantin level. Will consider switching to a new generation anticonvulsant. Will follow up.  Wyatt Portela, MD [facility] [phone]  12/09/2012, 1:41 PM

## 2012-12-09 NOTE — ED Notes (Signed)
To x-ray via stretcher NAD noted

## 2012-12-09 NOTE — ED Notes (Signed)
Patient attempted to get up to void unable to void, Patient felt an increase in nausea and started vomiting, went to get ED PA and walked back in the room and patient then started to have a seizure which lasted 30 seconds.

## 2012-12-09 NOTE — ED Notes (Signed)
Patient refusing In and Out catheter

## 2012-12-10 ENCOUNTER — Inpatient Hospital Stay (HOSPITAL_COMMUNITY): Payer: 59

## 2012-12-10 LAB — COMPREHENSIVE METABOLIC PANEL
ALT: 14 U/L (ref 0–53)
AST: 16 U/L (ref 0–37)
Calcium: 8.2 mg/dL — ABNORMAL LOW (ref 8.4–10.5)
Creatinine, Ser: 0.75 mg/dL (ref 0.50–1.35)
GFR calc Af Amer: >90 mL/min (ref >90)
Glucose, Bld: 106 mg/dL — ABNORMAL HIGH (ref 70–99)
Sodium: 139 mEq/L (ref 135–145)
Total Protein: 5.6 g/dL — ABNORMAL LOW (ref 6.0–8.3)

## 2012-12-10 LAB — CBC
HCT: 32.9 % — ABNORMAL LOW (ref 39.0–52.0)
Hemoglobin: 10.9 g/dL — ABNORMAL LOW (ref 13.0–17.0)
MCHC: 33.1 g/dL (ref 30.0–36.0)
RBC: 3.77 MIL/uL — ABNORMAL LOW (ref 4.22–5.81)

## 2012-12-10 LAB — TROPONIN I: Troponin I: <0.3 ng/mL (ref <0.30)

## 2012-12-10 MED ORDER — IBUPROFEN 600 MG PO TABS
600.0000 mg | ORAL_TABLET | Freq: Four times a day (QID) | ORAL | Status: DC | PRN
  Filled 2012-12-10: qty 1

## 2012-12-10 MED ORDER — PANTOPRAZOLE SODIUM 40 MG PO TBEC
40.0000 mg | DELAYED_RELEASE_TABLET | Freq: Every day | ORAL | Status: DC
  Administered 2012-12-11: 40 mg via ORAL
  Filled 2012-12-10 (×2): qty 1

## 2012-12-10 MED ORDER — PHENYTOIN 50 MG PO CHEW
100.0000 mg | CHEWABLE_TABLET | Freq: Three times a day (TID) | ORAL | Status: DC
  Administered 2012-12-10 – 2012-12-11 (×5): 100 mg via ORAL
  Filled 2012-12-10 (×6): qty 2

## 2012-12-10 MED ORDER — GADOBENATE DIMEGLUMINE 529 MG/ML IV SOLN
15.0000 mL | Freq: Once | INTRAVENOUS | Status: AC | PRN
  Administered 2012-12-10: 15 mL via INTRAVENOUS

## 2012-12-10 MED ORDER — OXYMETAZOLINE HCL 0.05 % NA SOLN
1.0000 | Freq: Once | NASAL | Status: AC
  Administered 2012-12-10: 1 via NASAL
  Filled 2012-12-10: qty 15

## 2012-12-10 NOTE — Progress Notes (Signed)
Chaplain responded to nurse's request and visited pt and family at ED Pod A12. Pt was half asleep and was recovering from seizure. Pt benefited from strong family support. Chaplain escorted family members to visited pt in his room. Chaplain shared words of encouragement and prayed with family. Pt was later admitted to 3309. Chaplain assured family of spiritual support as needed. Family thanked chaplain for prayer and emotional support. Kelle Darting [phone]  12/09/12 1300  Clinical Encounter Type  Visited With Patient and family together  Visit Type Initial;Spiritual support;ED  Referral From Nurse  Spiritual Encounters  Spiritual Needs Prayer;Emotional  Stress Factors  Patient Stress Factors None identified;Other (Comment) (lack of sleep)  Family Stress Factors Major life changes

## 2012-12-10 NOTE — Progress Notes (Signed)
[facility] PROGRESS NOTE  Seth Liu OZH:[id number] DOB: [birth year] DOA: 12/09/2012 PCP: No primary provider on file.  Assessment/Plan: 1. Seizures - breakthrough, Dilantin level sub therapeutic, missed yesterdays dose -Neuro Following -plan for EEG/MRI -continue Dilantin, loaded yesterday  2. Syncope: -due to 1 -EKG benign, cardiac enzymes x3 -DC echo  Code Status: FUll code Family Communication: none at bedside Disposition Plan: Tx to tele   Consultants:  NEurology  HPI/Subjective: Feels ok, some headache  Objective: Filed Vitals:   12/09/12 2026 12/09/12 2315 12/10/12 0335 12/10/12 0731  BP: 103/64 97/59 96/59  114/76  Pulse: 59 55 60 63  Temp: 97.6 F (36.4 C) 98.4 F (36.9 C) 97.6 F (36.4 C) 97.8 F (36.6 C)  TempSrc: Oral Oral Oral Oral  Resp: 14 17 12 16   Height:      Weight:      SpO2: 100% 100% 100% 100%    Intake/Output Summary (Last 24 hours) at 12/10/12 1501 Last data filed at 12/10/12 0900  Gross per 24 hour  Intake   1470 ml  Output    900 ml  Net    570 ml   Filed Weights   12/09/12 1400  Weight: 76 kg (167 lb 8.8 oz)    Exam:   General:  AAOx3  Cardiovascular: S1S2/RRR  Respiratory: CTAB  Abdomen: soft, NT, BS present  Musculoskeletal: no edema c/c  NEuro: non focal   Data Reviewed: Basic Metabolic Panel:  Recent Labs Lab 12/09/12 0945 12/10/12 0215  NA 138 139  K 3.2* 3.7  CL 107 106  CO2 23 28  GLUCOSE 127* 106*  BUN 8 6  CREATININE 0.77 0.75  CALCIUM 8.6 8.2*   Liver Function Tests:  Recent Labs Lab 12/10/12 0215  AST 16  ALT 14  ALKPHOS 87  BILITOT 0.3  PROT 5.6*  ALBUMIN 3.0*   No results found for this basename: LIPASE, AMYLASE,  in the last 168 hours No results found for this basename: AMMONIA,  in the last 168 hours CBC:  Recent Labs Lab 12/09/12 0945 12/10/12 0215  WBC 11.2* 8.6  NEUTROABS 9.4*  --   HGB 12.5* 10.9*  HCT 35.3* 32.9*  MCV 84.0 87.3  PLT 215 174   Cardiac  Enzymes:  Recent Labs Lab 12/09/12 1405 12/09/12 1914 12/10/12 0215  TROPONINI <0.30 <0.30 <0.30   BNP (last 3 results) No results found for this basename: PROBNP,  in the last 8760 hours CBG: No results found for this basename: GLUCAP,  in the last 168 hours  Recent Results (from the past 240 hour(s))  MRSA PCR SCREENING     Status: None   Collection Time    12/09/12  1:30 PM      Result Value Range Status   MRSA by PCR NEGATIVE  NEGATIVE Final   Comment:            The GeneXpert MRSA Assay (FDA     approved for NASAL specimens     only), is one component of a     comprehensive MRSA colonization     surveillance program. It is not     intended to diagnose MRSA     infection nor to guide or     monitor treatment for     MRSA infections.     Studies: Dg Chest 2 View  12/09/2012   *RADIOLOGY REPORT*  Clinical Data: Fall, rib and chest pain  CHEST - 2 VIEW  Comparison: CT abdomen/pelvis 12/20/2010  Findings: Slightly  low inspiratory volumes.  No pneumothorax, pleural effusion, pulmonary edema or focal airspace consolidation. Cardiac and mediastinal contours are within normal limits.  No acute fracture identified.  IMPRESSION: No acute cardiopulmonary disease.   Original Report Authenticated By: Malachy Moan, M.D.   Ct Head Wo Contrast  12/09/2012   *RADIOLOGY REPORT*  Clinical Data: Fall, syncope  CT HEAD WITHOUT CONTRAST  Technique:  Contiguous axial images were obtained from the base of the skull through the vertex without contrast.  Comparison: None.  Findings: No skull fracture.  No hemorrhage, infarct, mass, or hydrocephalus.  Normal sulcation.  IMPRESSION: Normal head CT   Original Report Authenticated By: Esperanza Heir, M.D.    Scheduled Meds: . antiseptic oral rinse  15 mL Mouth Rinse q12n4p  . chlorhexidine  15 mL Mouth Rinse BID  . enoxaparin (LOVENOX) injection  40 mg Subcutaneous q morning - 10a  . phenytoin  100 mg Oral TID  . sodium chloride  3 mL  Intravenous Q12H   Continuous Infusions:   Active Problems:   Syncope   Seizure   Essential hypertension, benign    Time spent:    [name]  [facility] Pager [phone]. If 7PM-7AM, please contact night-coverage at [email or web address], password [facility] 12/10/2012, 3:01 PM  LOS: 1 day

## 2012-12-10 NOTE — Progress Notes (Signed)
[facility] progress note. Chief complaint. Chest tightness and dyspnea. History of present illness. This 47 year old male hospitalized with seizures and syncope. There is no known history of coronary artery disease. The patient complained to nursing of chest tightness and dyspnea though this without evidence of tachypnea or desaturation. A 12-lead EKG was obtained and this resulted normal sinus rhythm with sinus arrhythmia, normal EKG. I came up to see the patient at bedside and chest tightness and dyspnea have resolved. Vital signs. Temperature 98.5, pulse 90, respiration 16, blood pressure 135/83. O2 sats 99%. General appearance. Well-developed middle-aged male who is alert, cooperative, and in no distress. Cardiac. Rate and rhythm regular. No jugular venous distention or edema. Lungs. Breath sounds clear and equal. Abdomen. Soft with positive bowel sounds. No pain. Extremities. No edema. Negative Homans. Impression/plan. Problem #1. Chest tightness and dyspnea. Patient is clinically stable without tachypnea or desaturations. 12-lead EKG is essentially unremarkable without indication of acute ischemia. Nonetheless I will cycle troponins now and every 6 hours for a total of 3 sets. Repeat EKG later this a.m. to evaluate for any possible changes.

## 2012-12-10 NOTE — Progress Notes (Signed)
Pt. Complaining  Of persistent headache.  States is nauseated, so medicated first with Zofran.  Call placed to receiving nurse on 3W to give Tylenol once nausea dissipates.

## 2012-12-11 ENCOUNTER — Telehealth: Payer: Self-pay | Admitting: Neurology

## 2012-12-11 ENCOUNTER — Inpatient Hospital Stay (HOSPITAL_COMMUNITY): Payer: 59

## 2012-12-11 LAB — ALBUMIN: Albumin: 3.2 g/dL — ABNORMAL LOW (ref 3.5–5.2)

## 2012-12-11 LAB — TROPONIN I: Troponin I: <0.3 ng/mL (ref <0.30)

## 2012-12-11 MED ORDER — OXYMETAZOLINE HCL 0.05 % NA SOLN
1.0000 | Freq: Two times a day (BID) | NASAL | Status: DC | PRN
  Administered 2012-12-11: 1 via NASAL
  Filled 2012-12-11: qty 15

## 2012-12-11 NOTE — Progress Notes (Signed)
Utilization review completed.  

## 2012-12-11 NOTE — Procedures (Signed)
EEG report.  Brief clinical history:  47 years old male with a well known history of GTC seizures, brought to [facility] ED with a cluster of seizures. Takes Dilantin daily.  Technique: this is a 17 channel routine scalp EEG performed at the bedside with bipolar and monopolar montages arranged in accordance to the international 10/20 system of electrode placement. One channel was dedicated to EKG recording.  The study was performed during wakefulness, drowsiness, and stage 2 sleep. Intermittent photic stimulation was utilized as activating procedure.  Description:In the wakeful state, the best background consisted of a medium amplitude, posterior dominant, well sustained, symmetric and reactive 10 Hz rhythm. Drowsiness demonstrated dropout of the alpha rhythm. Stage 2 sleep showed symmetric and synchronous sleep spindles without intermixed epileptiform discharges. Intermittent photic stimulation did induce a normal driving response.  No focal or generalized epileptiform discharges noted.  No slowing seen.  EKG showed sinus rhythm.  Impression: this is a normal awake and asleep EEG. Please, be aware that a normal EEG does not exclude the possibility of epilepsy.  Clinical correlation is advised.  Wyatt Portela, MD

## 2012-12-11 NOTE — Progress Notes (Signed)
Nutrition Brief Note  Patient identified on the Malnutrition Screening Tool (MST) Report. Pt reported weight loss PTA. Discussed weight hx with wife - she notes that he has lost between 10 - 15 lb since last Fall 2/2 more active job. This weight loss is not significant.  Wt Readings from Last 15 Encounters:  12/09/12 167 lb 8.8 oz (76 kg)  04/05/12 172 lb 6.4 oz (78.2 kg)    Body mass index is 27.88 kg/(m^2). Patient meets criteria for overweight based on current BMI.   Current diet order is Regular. Labs and medications reviewed.   No nutrition interventions warranted at this time. If nutrition issues arise, please consult RD.   Jarold Motto MS, RD, LDN Pager: [phone] After-hours pager: [phone]

## 2012-12-11 NOTE — Discharge Summary (Signed)
Physician Discharge Summary  Seth Liu ZOX:[id number] DOB: 19-May-1966 DOA: 12/09/2012  PCP: No primary provider on file.  Admit date: 12/09/2012 Discharge date: 12/11/2012  Time spent: 35 minutes  Recommendations for Outpatient Follow-up:  1. Dr.Willis in 1 week 2. Dilantin level in 1 week  Discharge Diagnoses:  Active Problems:   Syncope   Seizure   Essential hypertension, benign   Discharge Condition:stable, Instructed to avoid Driving, Operating machinery and avoiding underwater activity till cleared by Neurologist in follow up  Diet recommendation: low sodium  Filed Weights   12/09/12 1400  Weight: 76 kg (167 lb 8.8 oz)    History of present illness:  Seth Liu is a 47 y.o. male has a past medical history significant for seizure disorder on Dilantin, comes in after an episode of unresponsiveness at home this morning. His wife who is bedside reports that around 6:30 this morning she heard a "thump" and she saw him unresponsive, on the ground, appeared not to breath, blue lips and wide pupils. She called 911 and started CPR; patient became responsive in few minutes and was confused afterwards. In the ED, patient had a seizure episode.. Patient was confused in the ED but reported that he missed one dose of his Dilantin yesterday. He does not recall and wife is unaware of any symptoms in the prior hours or days such as chest pain, SOB, lightheadedness or dizziness, abdominal pain, nausea, vomiting, fevers or chills. Patient's last seizure episode was in 1999. His medical history also includes HTN which is controlled with medications.   Hospital Course:  He was reloaded with Dilantin upon admission, followed  By Neurology on consultation. Restarted on Po Dilantin as well. Remained seizure free for 48hours. Also had an EEG and MRI which were normal. Dilantin level at discharge was 9.7, corrected for Albumin it was 13, He is advised to continue 300mg  DIlantin ER daily Qpm,  starting tonight.  Procedures: EEG   Consultations:  Neurology Dr.Camillo  Discharge Exam: Filed Vitals:   12/10/12 1730 12/10/12 2030 12/11/12 0610 12/11/12 1350  BP: 122/72 135/83 116/73 148/75  Pulse:  90 57 89  Temp:  98.5 F (36.9 C) 98.3 F (36.8 C) 99.1 F (37.3 C)  TempSrc:   Oral Oral  Resp:   18 18  Height:      Weight:      SpO2:  99% 99% 98%    General: AAOx3 Cardiovascular:S1S2/RRR Respiratory: CTAB  Discharge Instructions  Discharge Orders   Future Orders Complete By Expires     Diet - low sodium heart healthy  As directed     Discharge instructions  As directed     Comments:      Do not drive, operate machinery and avoid underwater activities until seen and cleared by Neurologist in Follow up to do so.    Increase activity slowly  As directed         Medication List         bisoprolol-hydrochlorothiazide 10-6.25 MG per tablet  Commonly known as:  ZIAC  Take 1 tablet by mouth daily.     CALCIUM + D PO  Take 1 tablet by mouth daily.     DEXILANT 60 MG capsule  Generic drug:  dexlansoprazole  Take 60 mg by mouth daily as needed (Acid reflux).     lisinopril 20 MG tablet  Commonly known as:  PRINIVIL,ZESTRIL  Take 20 mg by mouth daily.     MULTIVITAMIN PO  Take 1 tablet by mouth  daily.     phenytoin 100 MG ER capsule  Commonly known as:  DILANTIN  Take 3 capsules (300 mg total) by mouth every evening.       No Known Allergies     Follow-up Information   Follow up with Seth Dukes, MD. Schedule an appointment as soon as possible for a visit in 1 week.   Contact information:   [address] [place] [address] [phone]       Follow up with Lab-Dilantin level In 1 week.       The results of significant diagnostics from this hospitalization (including imaging, microbiology, ancillary and laboratory) are listed below for reference.    Significant Diagnostic Studies: Dg Chest 2 View  12/09/2012    *RADIOLOGY REPORT*  Clinical Data: Fall, rib and chest pain  CHEST - 2 VIEW  Comparison: CT abdomen/pelvis 12/20/2010  Findings: Slightly low inspiratory volumes.  No pneumothorax, pleural effusion, pulmonary edema or focal airspace consolidation. Cardiac and mediastinal contours are within normal limits.  No acute fracture identified.  IMPRESSION: No acute cardiopulmonary disease.   Original Report Authenticated By: Malachy Moan, M.D.   Ct Head Wo Contrast  12/09/2012   *RADIOLOGY REPORT*  Clinical Data: Fall, syncope  CT HEAD WITHOUT CONTRAST  Technique:  Contiguous axial images were obtained from the base of the skull through the vertex without contrast.  Comparison: None.  Findings: No skull fracture.  No hemorrhage, infarct, mass, or hydrocephalus.  Normal sulcation.  IMPRESSION: Normal head CT   Original Report Authenticated By: Esperanza Heir, M.D.   Mr Laqueta Jean Wo Contrast  12/10/2012   *RADIOLOGY REPORT*  Clinical Data: 47 year old male with seizures.  Found down. Seizure in the emergency department.  Confusion.  MRI HEAD WITHOUT AND WITH CONTRAST  Technique:  Multiplanar, multiecho pulse sequences of the brain and surrounding structures were obtained according to standard protocol without and with intravenous contrast  Contrast: 15mL MULTIHANCE GADOBENATE DIMEGLUMINE 529 MG/ML IV SOLN  Comparison: Head CT 12/09/2012.  Findings: Normal cerebral volume. No midline shift, ventriculomegaly, mass effect, evidence of mass lesion, extra-axial collection or acute intracranial hemorrhage.  Cervicomedullary junction and pituitary are within normal limits.  Negative visualized cervical spine. Major intracranial vascular flow voids are preserved.  On diffusion weighted imaging there is a punctate area of possible diffusion restriction in the left corona radiata (series 4 image 17).  This is subtle on the ADC map.  There is no associated signal change here on the remaining sequences, favor this is artifact.  Elsewhere No restricted diffusion to suggest acute infarction.  Occasional subcortical white matter T2 and FLAIR hyperintensity, the degree to which this is present is generally considered within normal limits for this age.  No definite asymmetry of the hippocampal complex signal or morphology.  Mesial temporal lobes signal elsewhere within normal limits.  Wallace Cullens and white matter signal elsewhere in the brain within normal limits.  Incidental developmental venous anomaly left superior frontal gyrus. No abnormal enhancement identified.  Visualized bone marrow signal is within normal limits.  Visualized orbit soft tissues are within normal limits.  Minor paranasal sinus mucosal thickening.  Grossly normal visualized internal auditory structures.  Mastoids are clear.  Negative scalp soft tissues.  IMPRESSION: No acute intracranial abnormality.  Suspect punctate signal artifact on diffusion weighted imaging in the left corona radiata. Elsewhere normal MRI appearance of the brain.   Original Report Authenticated By: Erskine Speed, M.D.    Microbiology: Recent Results (from the  past 240 hour(s))  MRSA PCR SCREENING     Status: None   Collection Time    12/09/12  1:30 PM      Result Value Range Status   MRSA by PCR NEGATIVE  NEGATIVE Final   Comment:            The GeneXpert MRSA Assay (FDA     approved for NASAL specimens     only), is one component of a     comprehensive MRSA colonization     surveillance program. It is not     intended to diagnose MRSA     infection nor to guide or     monitor treatment for     MRSA infections.     Labs: Basic Metabolic Panel:  Recent Labs Lab 12/09/12 0945 12/10/12 0215  NA 138 139  K 3.2* 3.7  CL 107 106  CO2 23 28  GLUCOSE 127* 106*  BUN 8 6  CREATININE 0.77 0.75  CALCIUM 8.6 8.2*   Liver Function Tests:  Recent Labs Lab 12/10/12 0215 12/11/12 1216  AST 16  --   ALT 14  --   ALKPHOS 87  --   BILITOT 0.3  --   PROT 5.6*  --   ALBUMIN 3.0*  3.2*   No results found for this basename: LIPASE, AMYLASE,  in the last 168 hours No results found for this basename: AMMONIA,  in the last 168 hours CBC:  Recent Labs Lab 12/09/12 0945 12/10/12 0215  WBC 11.2* 8.6  NEUTROABS 9.4*  --   HGB 12.5* 10.9*  HCT 35.3* 32.9*  MCV 84.0 87.3  PLT 215 174   Cardiac Enzymes:  Recent Labs Lab 12/09/12 1405 12/09/12 1914 12/10/12 0215 12/10/12 2230 12/11/12 0458  TROPONINI <0.30 <0.30 <0.30 <0.30 <0.30   BNP: BNP (last 3 results) No results found for this basename: PROBNP,  in the last 8760 hours CBG: No results found for this basename: GLUCAP,  in the last 168 hours     Signed:  Fiora Weill  [facility] 12/11/2012, 5:49 PM

## 2012-12-11 NOTE — Progress Notes (Signed)
EEG Completed; Results Pending  

## 2012-12-11 NOTE — Progress Notes (Signed)
[facility] PROGRESS NOTE  Seth Liu JXB:[id number] DOB: 1965-10-25 DOA: 12/09/2012 PCP: No primary provider on file.  Assessment/Plan: 1. Seizures x2 - breakthrough, Dilantin level sub therapeutic, missed Dilantin dose the day prior to admission -Neuro Following -plan for EEG,  -MRI benign -continue Dilantin, loaded on admission  2. Syncope: -due to 1 -EKG benign, cardiac enzymes x3 -DC echo  3. transient chest irritation last night after IV Zofran -Resolved, EKG cardiac enzymes normal  Code Status: FUll code Family Communication: none at bedside Disposition Plan: Tx to tele   Consultants:  NEurology  HPI/Subjective: Feels ok, some sinus congestion  Objective: Filed Vitals:   2012/12/13 1730 Dec 13, 2012 2030 12/11/12 0610 12/11/12 1350  BP: 122/72 135/83 116/73 148/75  Pulse:  90 57 89  Temp:  98.5 F (36.9 C) 98.3 F (36.8 C) 99.1 F (37.3 C)  TempSrc:   Oral Oral  Resp:   18 18  Height:      Weight:      SpO2:  99% 99% 98%   No intake or output data in the 24 hours ending 12/11/12 1435 Filed Weights   12/09/12 1400  Weight: 76 kg (167 lb 8.8 oz)    Exam:   General:  AAOx3  Cardiovascular: S1S2/RRR  Respiratory: CTAB  Abdomen: soft, NT, BS present  Musculoskeletal: no edema c/c  NEuro: non focal   Data Reviewed: Basic Metabolic Panel:  Recent Labs Lab 12/09/12 0945 12/13/12 0215  NA 138 139  K 3.2* 3.7  CL 107 106  CO2 23 28  GLUCOSE 127* 106*  BUN 8 6  CREATININE 0.77 0.75  CALCIUM 8.6 8.2*   Liver Function Tests:  Recent Labs Lab 12/13/12 0215  AST 16  ALT 14  ALKPHOS 87  BILITOT 0.3  PROT 5.6*  ALBUMIN 3.0*   No results found for this basename: LIPASE, AMYLASE,  in the last 168 hours No results found for this basename: AMMONIA,  in the last 168 hours CBC:  Recent Labs Lab 12/09/12 0945 12/13/2012 0215  WBC 11.2* 8.6  NEUTROABS 9.4*  --   HGB 12.5* 10.9*  HCT 35.3* 32.9*  MCV 84.0 87.3  PLT 215 174    Cardiac Enzymes:  Recent Labs Lab 12/09/12 1405 12/09/12 1914 12-13-2012 0215 12-13-2012 2230 12/11/12 0458  TROPONINI <0.30 <0.30 <0.30 <0.30 <0.30   BNP (last 3 results) No results found for this basename: PROBNP,  in the last 8760 hours CBG: No results found for this basename: GLUCAP,  in the last 168 hours  Recent Results (from the past 240 hour(s))  MRSA PCR SCREENING     Status: None   Collection Time    12/09/12  1:30 PM      Result Value Range Status   MRSA by PCR NEGATIVE  NEGATIVE Final   Comment:            The GeneXpert MRSA Assay (FDA     approved for NASAL specimens     only), is one component of a     comprehensive MRSA colonization     surveillance program. It is not     intended to diagnose MRSA     infection nor to guide or     monitor treatment for     MRSA infections.     Studies: Mr Laqueta Jean ZH Contrast  12-13-2012   *RADIOLOGY REPORT*  Clinical Data: 47 year old male with seizures.  Found down. Seizure in the emergency department.  Confusion.  MRI HEAD WITHOUT  AND WITH CONTRAST  Technique:  Multiplanar, multiecho pulse sequences of the brain and surrounding structures were obtained according to standard protocol without and with intravenous contrast  Contrast: 15mL MULTIHANCE GADOBENATE DIMEGLUMINE 529 MG/ML IV SOLN  Comparison: Head CT 12/09/2012.  Findings: Normal cerebral volume. No midline shift, ventriculomegaly, mass effect, evidence of mass lesion, extra-axial collection or acute intracranial hemorrhage.  Cervicomedullary junction and pituitary are within normal limits.  Negative visualized cervical spine. Major intracranial vascular flow voids are preserved.  On diffusion weighted imaging there is a punctate area of possible diffusion restriction in the left corona radiata (series 4 image 17).  This is subtle on the ADC map.  There is no associated signal change here on the remaining sequences, favor this is artifact. Elsewhere No restricted diffusion  to suggest acute infarction.  Occasional subcortical white matter T2 and FLAIR hyperintensity, the degree to which this is present is generally considered within normal limits for this age.  No definite asymmetry of the hippocampal complex signal or morphology.  Mesial temporal lobes signal elsewhere within normal limits.  Wallace Cullens and white matter signal elsewhere in the brain within normal limits.  Incidental developmental venous anomaly left superior frontal gyrus. No abnormal enhancement identified.  Visualized bone marrow signal is within normal limits.  Visualized orbit soft tissues are within normal limits.  Minor paranasal sinus mucosal thickening.  Grossly normal visualized internal auditory structures.  Mastoids are clear.  Negative scalp soft tissues.  IMPRESSION: No acute intracranial abnormality.  Suspect punctate signal artifact on diffusion weighted imaging in the left corona radiata. Elsewhere normal MRI appearance of the brain.   Original Report Authenticated By: Erskine Speed, M.D.    Scheduled Meds: . antiseptic oral rinse  15 mL Mouth Rinse q12n4p  . chlorhexidine  15 mL Mouth Rinse BID  . enoxaparin (LOVENOX) injection  40 mg Subcutaneous q morning - 10a  . pantoprazole  40 mg Oral Q1200  . phenytoin  100 mg Oral TID  . sodium chloride  3 mL Intravenous Q12H   Continuous Infusions:   Active Problems:   Syncope   Seizure   Essential hypertension, benign    Time spent:    [facility]  [facility] Pager [phone]. If 7PM-7AM, please contact night-coverage at [email or web address], password [facility] 12/11/2012, 2:35 PM  LOS: 2 days

## 2012-12-11 NOTE — Progress Notes (Signed)
At approx 2015 pt called out stating he was nauseated and still had a severe headache.  Pt was just given pain med prior to shift change--pt was given IV zofran 4 mg and then stated he "felt weird".  Pt states he was short of breath and his chest felt heavy, Pt also requesting nasal spray for sinus congestion. VS stable BP 135/83 O2 sats 99% on room air HR 90 SR on telemetry RR16.  Lenny Pastel NP on call and notified---EKG obtained with minor changes noted (T wave inversion AVR V1 and minimal ST depression II V6) . NP came to floor to assess pt.  Episode resolved and pt with out complaints. Dierdre Highman, RN

## 2012-12-11 NOTE — Progress Notes (Signed)
NEURO HOSPITALIST PROGRESS NOTE   SUBJECTIVE:                                                                                                                         Mild HA.  No other complaints.  OBJECTIVE:                                                                                                                           Vital signs in last 24 hours: Temp:  [98.2 F (36.8 C)-98.5 F (36.9 C)] 98.3 F (36.8 C) (07/21 0610) Pulse Rate:  [57-90] 57 (07/21 0610) Resp:  [16-18] 18 (07/21 0610) BP: (116-135)/(72-83) 116/73 mmHg (07/21 0610) SpO2:  [95 %-99 %] 99 % (07/21 0610)  Intake/Output from previous day: 07/20 0701 - 07/21 0700 In: 225 [I.V.:225] Out: 200 [Urine:200] Intake/Output this shift:   Nutritional status: General  Past Medical History  Diagnosis Date  . Cancer february 1990    melanoma stage 3 left shoulder blade  . Seizures      Neurologic Exam:  Mental Status: Alert, oriented, thought content appropriate.  Speech fluent without evidence of aphasia.  Able to follow 3 step commands without difficulty. Cranial Nerves: II: Discs flat bilaterally; Visual fields grossly normal, pupils equal, round, reactive to light and accommodation III,IV, VI: ptosis not present, extra-ocular motions intact bilaterally V,VII: smile symmetric, facial light touch sensation normal bilaterally VIII: hearing normal bilaterally IX,X: gag reflex present XI: bilateral shoulder shrug XII: midline tongue extension Motor: Right : Upper extremity   5/5    Left:     Upper extremity   5/5  Lower extremity   5/5     Lower extremity   5/5 Tone and bulk:normal tone throughout; no atrophy noted Sensory: Pinprick and light touch intact throughout, bilaterally Deep Tendon Reflexes:  Right: Upper Extremity   Left: Upper extremity   biceps (C-5 to C-6) 2/4   biceps (C-5 to C-6) 2/4 tricep (C7) 2/4    triceps (C7) 2/4 Brachioradialis (C6)  2/4  Brachioradialis (C6) 2/4  Lower Extremity Lower Extremity  quadriceps (L-2 to L-4) 2/4   quadriceps (L-2 to L-4) 2/4 Achilles (S1) 2/4   Achilles (S1) 2/4  Plantars: Right: downgoing  Left: downgoing Cerebellar: normal finger-to-nose,  normal heel-to-shin test CV: pulses palpable throughout    Lab Results: No results found for this basename: cbc, bmp, coags, chol, tri, ldl, hga1c   Lipid Panel No results found for this basename: CHOL, TRIG, HDL, CHOLHDL, VLDL, LDLCALC,  in the last 72 hours  Studies/Results: Mr Laqueta Jean Wo Contrast  12/10/2012   *RADIOLOGY REPORT*  Clinical Data: 47 year old male with seizures.  Found down. Seizure in the emergency department.  Confusion.  MRI HEAD WITHOUT AND WITH CONTRAST  Technique:  Multiplanar, multiecho pulse sequences of the brain and surrounding structures were obtained according to standard protocol without and with intravenous contrast  Contrast: 15mL MULTIHANCE GADOBENATE DIMEGLUMINE 529 MG/ML IV SOLN  Comparison: Head CT 12/09/2012.  Findings: Normal cerebral volume. No midline shift, ventriculomegaly, mass effect, evidence of mass lesion, extra-axial collection or acute intracranial hemorrhage.  Cervicomedullary junction and pituitary are within normal limits.  Negative visualized cervical spine. Major intracranial vascular flow voids are preserved.  On diffusion weighted imaging there is a punctate area of possible diffusion restriction in the left corona radiata (series 4 image 17).  This is subtle on the ADC map.  There is no associated signal change here on the remaining sequences, favor this is artifact. Elsewhere No restricted diffusion to suggest acute infarction.  Occasional subcortical white matter T2 and FLAIR hyperintensity, the degree to which this is present is generally considered within normal limits for this age.  No definite asymmetry of the hippocampal complex signal or morphology.  Mesial temporal lobes signal elsewhere  within normal limits.  Wallace Cullens and white matter signal elsewhere in the brain within normal limits.  Incidental developmental venous anomaly left superior frontal gyrus. No abnormal enhancement identified.  Visualized bone marrow signal is within normal limits.  Visualized orbit soft tissues are within normal limits.  Minor paranasal sinus mucosal thickening.  Grossly normal visualized internal auditory structures.  Mastoids are clear.  Negative scalp soft tissues.  IMPRESSION: No acute intracranial abnormality.  Suspect punctate signal artifact on diffusion weighted imaging in the left corona radiata. Elsewhere normal MRI appearance of the brain.   Original Report Authenticated By: Erskine Speed, M.D.    MEDICATIONS                                                                                                                        Scheduled: . antiseptic oral rinse  15 mL Mouth Rinse q12n4p  . chlorhexidine  15 mL Mouth Rinse BID  . enoxaparin (LOVENOX) injection  40 mg Subcutaneous q morning - 10a  . pantoprazole  40 mg Oral Q1200  . phenytoin  100 mg Oral TID  . sodium chloride  3 mL Intravenous Q12H    ASSESSMENT/PLAN:  Break through seizure in setting sub therapeutic Dilantin level.   1) Dilantin level 2) EEG 3) No driving, operating heavy machinery, perform activities at heights, swimming or participation in water activities until release by outpatient physician.  This has been discussed with patient.   If dilantin level is therapeutic and EEG is normal, would have patient follow up within one week with Dr. Anne Hahn. No other recommendations.   Assessment and plan discussed with with attending physician and they are in agreement.    Felicie Morn PA-C [facility] [phone]  12/11/2012, 12:16 PM

## 2012-12-12 ENCOUNTER — Telehealth: Payer: Self-pay | Admitting: Neurology

## 2012-12-12 ENCOUNTER — Encounter: Payer: Self-pay | Admitting: Neurology

## 2012-12-12 NOTE — Telephone Encounter (Signed)
I called and spoke with the patient and he is scheduled for Aug. 12@12 .

## 2012-12-12 NOTE — Telephone Encounter (Signed)
I called and left a message for the patient's spouse that the patient could be placed on a cancellation list which was told to him, but Dr. Pattricia Boss has no opening.

## 2012-12-20 ENCOUNTER — Other Ambulatory Visit: Payer: Self-pay | Admitting: *Deleted

## 2012-12-20 ENCOUNTER — Other Ambulatory Visit: Payer: Self-pay | Admitting: Neurology

## 2012-12-20 ENCOUNTER — Telehealth: Payer: Self-pay | Admitting: Neurology

## 2012-12-20 DIAGNOSIS — Z5181 Encounter for therapeutic drug level monitoring: Secondary | ICD-10-CM

## 2012-12-20 MED ORDER — PHENYTOIN SODIUM EXTENDED 100 MG PO CAPS: 300.0000 mg | ORAL_CAPSULE | Freq: Every evening | ORAL | Status: DC

## 2012-12-20 NOTE — Telephone Encounter (Signed)
Patient's spouse called needing a lab order for Dilantin for her husband.

## 2012-12-20 NOTE — Telephone Encounter (Signed)
I called and spoke with the patient's spouse who stated that Dr. Clarene Duke will do the order Dilantin.

## 2012-12-22 ENCOUNTER — Encounter: Payer: Self-pay | Admitting: Neurology

## 2012-12-22 DIAGNOSIS — Z5181 Encounter for therapeutic drug level monitoring: Secondary | ICD-10-CM

## 2012-12-22 DIAGNOSIS — G40309 Generalized idiopathic epilepsy and epileptic syndromes, not intractable, without status epilepticus: Secondary | ICD-10-CM

## 2012-12-22 DIAGNOSIS — Z85820 Personal history of malignant melanoma of skin: Secondary | ICD-10-CM

## 2012-12-22 DIAGNOSIS — I10 Essential (primary) hypertension: Secondary | ICD-10-CM

## 2012-12-22 DIAGNOSIS — G43019 Migraine without aura, intractable, without status migrainosus: Secondary | ICD-10-CM

## 2012-12-25 ENCOUNTER — Ambulatory Visit (INDEPENDENT_AMBULATORY_CARE_PROVIDER_SITE_OTHER): Payer: 59 | Admitting: Neurology

## 2012-12-25 ENCOUNTER — Telehealth: Payer: Self-pay | Admitting: Neurology

## 2012-12-25 ENCOUNTER — Encounter: Payer: Self-pay | Admitting: Neurology

## 2012-12-25 VITALS — BP 134/94 | HR 63 | Wt 161.0 lb

## 2012-12-25 DIAGNOSIS — G40309 Generalized idiopathic epilepsy and epileptic syndromes, not intractable, without status epilepticus: Secondary | ICD-10-CM

## 2012-12-25 DIAGNOSIS — Z5181 Encounter for therapeutic drug level monitoring: Secondary | ICD-10-CM

## 2012-12-25 MED ORDER — PHENYTOIN 50 MG PO CHEW: 50.0000 mg | CHEWABLE_TABLET | Freq: Every day | ORAL | Status: DC

## 2012-12-25 NOTE — Telephone Encounter (Signed)
Patient's spouse called stating she would like to speak with physician concerning her husband's note about not driving for 6 months. Patient's spouse would like to be call on 12-26-12@7 :30 if possible. Number to call is  [phone]

## 2012-12-25 NOTE — Progress Notes (Signed)
Reason for visit: Seizures  Seth Liu is an 47 y.o. male  History of present illness:  Seth Liu is a 47 year old right-handed white male with a history of a seizure disorder. The patient has been well controlled on Dilantin, and typically he runs Dilantin levels anywhere from 4.5 to around 9, and does well with this. Around 12/09/2012, the patient had at least 2 generalized seizures. One occurred at home, and another was witnessed in the hospital. The patient had a Dilantin level of less than 2.5. The patient had missed the Dilantin dose the day prior, and he had just taken his medications within moments prior to the seizure. The patient indicates that he has been working 2 jobs, and he has been getting minimal sleep. The patient works as a [employer] manager, and his job is to operate a Librarian, academic throughout the day. The patient returns for an evaluation. The patient did not suffer an injury with the seizure.   Past Medical History  Diagnosis Date  . Cancer february 1990    melanoma stage 3 left shoulder blade  . Seizures   . Hypertension   . History of headache   . History of renal calculi   . Headache(784.0)   . Closed left arm fracture     Past Surgical History  Procedure Laterality Date  . Fracture surgery    . Shoulder surgery      Melanoma rexected    Family History  Problem Relation Age of Onset  . Depression Mother   . Breast cancer Mother     Social history:  reports that he has never smoked. He does not have any smokeless tobacco history on file. He reports that  drinks alcohol. He reports that he does not use illicit drugs.  Allergies: No Known Allergies  Medications:  Current Outpatient Prescriptions on File Prior to Visit  Medication Sig Dispense Refill  . bisoprolol-hydrochlorothiazide (ZIAC) 10-6.25 MG per tablet Take 1 tablet by mouth daily.      . Calcium Carbonate-Vitamin D (CALCIUM + D PO) Take 1 tablet by mouth daily.      Marland Kitchen dexlansoprazole (DEXILANT) 60  MG capsule Take 60 mg by mouth daily as needed (Acid reflux).       Marland Kitchen lisinopril (PRINIVIL,ZESTRIL) 20 MG tablet Take 20 mg by mouth daily.      . Multiple Vitamins-Minerals (MULTIVITAMIN PO) Take 1 tablet by mouth daily.      . phenytoin (DILANTIN) 100 MG ER capsule Take 3 capsules (300 mg total) by mouth every evening.  270 capsule  0   No current facility-administered medications on file prior to visit.    ROS:  Out of a complete 14 system review of symptoms, the patient complains only of the following symptoms, and all other reviewed systems are negative.   Weight loss, fatigue  Blurred vision Headache Seizures, passing out Allergies Not enough sleep Shift work, restless legs, sleepiness  Blood pressure 134/94, pulse 63, weight 161 lb (73.029 kg).  Physical Exam  General: The patient is alert and cooperative at the time of the examination.  Skin: No significant peripheral edema is noted.   Neurologic Exam  Cranial nerves: Facial symmetry is present. Speech is normal, no aphasia or dysarthria is noted. Extraocular movements are full. Visual fields are full.  Motor: The patient has good strength in all 4 extremities.  Coordination: The patient has good finger-nose-finger and heel-to-shin bilaterally.  Gait and station: The patient has a normal gait. Tandem gait is  normal. Romberg is negative. No drift is seen.  Reflexes: Deep tendon reflexes are symmetric.   Assessment/Plan:  One. History of seizures with recent recurrence  The patient has suffered a seizure after missing on one dose of the medication. The levels were quite low at the time of the seizure. The patient typically runs low Dilantin levels, and he does well with this. The dosing will need to be increased slightly. The patient is on 300 mg of Dilantin daily, and we will add another 50 mg to this. The patient also has been working 2 jobs, and he has not been getting adequate sleep. Sleep deprivation is a  significant activator for his seizures. The patient will followup in about 6 months. The patient is not to drive for 3 to 6 months following the most recent seizure. The patient will need to go on short-term disability from his job.  Marlan Palau MD 12/25/2012 8:33 PM  [facility] [address] [place], [place] [address]  Phone [phone] Fax [phone]

## 2012-12-25 NOTE — Telephone Encounter (Signed)
I called patient. I talked with the wife. The patient does not have short-term disability through his job. The patient has child support to pay in [place]. The patient still needs to wait at least 3 months without driving. The patient may need a letter for legal purposes. I will be happy to provide this. The patient will have a Dilantin level in 2 weeks.

## 2013-01-02 ENCOUNTER — Ambulatory Visit: Payer: Self-pay | Admitting: Neurology

## 2013-01-11 ENCOUNTER — Other Ambulatory Visit: Payer: Self-pay | Admitting: Neurology

## 2013-01-12 ENCOUNTER — Telehealth: Payer: Self-pay | Admitting: Neurology

## 2013-01-12 NOTE — Telephone Encounter (Signed)
I called patient. The Dilantin level is still low at 6.7. The patient will be increased on the time taking 200 mg twice daily.

## 2013-01-31 ENCOUNTER — Telehealth: Payer: Self-pay | Admitting: Neurology

## 2013-01-31 NOTE — Telephone Encounter (Signed)
Patient calling wanting to know when will he be able to drive?

## 2013-01-31 NOTE — Telephone Encounter (Signed)
I called the patient. The patient has not had any further seizures. He wants no when he can get back to work. The Dilantin was readjusted when last seen. We will recheck the Dilantin level. The patient does without any further seizures for least one more month, he can get back to work.

## 2013-02-22 ENCOUNTER — Telehealth: Payer: Self-pay | Admitting: Neurology

## 2013-02-22 NOTE — Telephone Encounter (Signed)
Left message for patient to return call with an explanation of what he meant by  "all meds to be discontinued" and Dilantin called in.

## 2013-02-26 MED ORDER — PHENYTOIN SODIUM EXTENDED 100 MG PO CAPS: 200.0000 mg | ORAL_CAPSULE | Freq: Two times a day (BID) | ORAL | Status: DC

## 2013-02-26 NOTE — Telephone Encounter (Signed)
Patient left message that he is now taking 400mg  total Dilantin a day, this has changed from 300mg  to 350mg  to now 400mg .  He would like for all previous prescriptions to be cancelled and for one RX for the GENERIC Dilantin for 400mg  to go to [facility] for a 90 day supply.  [phone]

## 2013-02-26 NOTE — Telephone Encounter (Signed)
Prescription was called in for the Dilantin. 90 day supply was given.

## 2013-03-06 ENCOUNTER — Other Ambulatory Visit: Payer: Self-pay | Admitting: Neurology

## 2013-03-07 ENCOUNTER — Telehealth: Payer: Self-pay | Admitting: Neurology

## 2013-03-07 LAB — PHENYTOIN LEVEL, TOTAL: Phenytoin Lvl: 9.4 ug/mL — ABNORMAL LOW (ref 10.0–20.0)

## 2013-03-07 NOTE — Telephone Encounter (Signed)
Questions for Dr Anne Hahn

## 2013-03-07 NOTE — Telephone Encounter (Signed)
I called patient. He is running good Dilantin levels for him. The patient has been well controlled with the levels in the 9.5 range previously. The patient may return to driving by the 09WJ of this month if he does well. The patient wants to have a r in this, I will write.

## 2013-03-07 NOTE — Telephone Encounter (Signed)
I called patient. The Dilantin level was 9.4. In the past, he has done quite well on this level. The patient had seizures in July 2000 working, Dilantin level at that time was less than 2.5. If the patient does well by the end of October, he may return to driving.

## 2013-06-27 ENCOUNTER — Encounter: Payer: Self-pay | Admitting: Nurse Practitioner

## 2013-06-27 ENCOUNTER — Encounter (INDEPENDENT_AMBULATORY_CARE_PROVIDER_SITE_OTHER): Payer: Self-pay

## 2013-06-27 ENCOUNTER — Ambulatory Visit (INDEPENDENT_AMBULATORY_CARE_PROVIDER_SITE_OTHER): Payer: 59 | Admitting: Nurse Practitioner

## 2013-06-27 ENCOUNTER — Ambulatory Visit: Payer: 59 | Admitting: Nurse Practitioner

## 2013-06-27 VITALS — BP 119/79 | HR 69 | Ht 66.25 in | Wt 167.0 lb

## 2013-06-27 DIAGNOSIS — G40309 Generalized idiopathic epilepsy and epileptic syndromes, not intractable, without status epilepticus: Secondary | ICD-10-CM

## 2013-06-27 DIAGNOSIS — Z5181 Encounter for therapeutic drug level monitoring: Secondary | ICD-10-CM

## 2013-06-27 NOTE — Progress Notes (Signed)
[facility]  PATIENT: Seth Liu DOB: 04/06/1966   REASON FOR VISIT: Followup for seizure disorder   HISTORY OF PRESENT ILLNESS: Mr. Seth Liu, 48 year old white male returns for followup. He was last seen in this office by Dr. Jannifer Liu 12/25/2012. He has a history of seizure disorder with last seizure occurring on 12/09/2012. He had 2 generalized seizures at that time went home and another witnessed in the hospital he had a Dilantin level of less than 2.5. He had missed a Dilantin dose. He is currently taking 200 mg twice daily without further seizure events. He is back to driving. He denies side effects to the medication, no balance issues no falls no problems with his gums. He returns for reevaluation   HISTORY:of a seizure disorder. The patient has been well controlled on Dilantin, and typically he runs Dilantin levels anywhere from 4.5 to around 9, and does well with this. Around 12/09/2012, the patient had at least 2 generalized seizures. One occurred at home, and another was witnessed in the hospital. The patient had a Dilantin level of less than 2.5. The patient had missed the Dilantin dose the day prior, and he had just taken his medications within moments prior to the seizure. The patient indicates that he has been working 2 jobs, and he has been getting minimal sleep. The patient works as a Forensic scientist, and his job is to operate a Teacher, music throughout the day. The patient returns for an evaluation. The patient did not suffer an injury with the seizure.    REVIEW OF SYSTEMS: Full 14 system review of systems performed and notable only for those listed, all others are neg:  Constitutional: N/A  Cardiovascular: N/A  Ear/Nose/Throat: N/A  Skin: N/A  Eyes: N/A  Respiratory: N/A  Gastroitestinal: N/A  Hematology/Lymphatic: N/A  Endocrine: N/A Musculoskeletal: Achy muscles Allergy/Immunology: N/A  Neurological: Seizure Psychiatric: N/A   ALLERGIES: No Known  Allergies  HOME MEDICATIONS: Outpatient Prescriptions Prior to Visit  Medication Sig Dispense Refill  . bisoprolol-hydrochlorothiazide (ZIAC) 10-6.25 MG per tablet Take 1 tablet by mouth daily.      . Calcium Carbonate-Vitamin D (CALCIUM + D PO) Take 1 tablet by mouth daily.      Marland Kitchen dexlansoprazole (DEXILANT) 60 MG capsule Take 60 mg by mouth daily as needed (Acid reflux).       Marland Kitchen lisinopril (PRINIVIL,ZESTRIL) 20 MG tablet Take 20 mg by mouth daily.      . Multiple Vitamins-Minerals (MULTIVITAMIN PO) Take 1 tablet by mouth daily.      . phenytoin (DILANTIN) 100 MG ER capsule Take 2 capsules (200 mg total) by mouth 2 (two) times daily.  360 capsule  3   No facility-administered medications prior to visit.    PAST MEDICAL HISTORY: Past Medical History  Diagnosis Date  . Cancer february 1990    melanoma stage 3 left shoulder blade  . Seizures   . Hypertension   . History of headache   . History of renal calculi   . Headache(784.0)   . Closed left arm fracture     PAST SURGICAL HISTORY: Past Surgical History  Procedure Laterality Date  . Fracture surgery    . Shoulder surgery      Melanoma rexected    FAMILY HISTORY: Family History  Problem Relation Age of Onset  . Depression Mother   . Breast cancer Mother     SOCIAL HISTORY: History   Social History  . Marital Status: Married    Spouse Name: N/A  Number of Children: 0  . Years of Education: 12   Occupational History  . Service Representative    Social History Main Topics  . Smoking status: Never Smoker   . Smokeless tobacco: Never Used  . Alcohol Use: Yes     Comment: 2 beers year  . Drug Use: No  . Sexual Activity: Yes   Other Topics Concern  . Not on file   Social History Narrative  . No narrative on file     PHYSICAL EXAM  Filed Vitals:   06/27/13 1513  BP: 119/79  Pulse: 69  Height: 5' 6.25" (1.683 m)  Weight: 167 lb (75.751 kg)   Body mass index is 26.74 kg/(m^2).  Generalized:  Well developed, in no acute distress  Neurological examination   Mentation: Alert oriented to time, place, history taking. Follows all commands speech and language fluent  Cranial nerve II-XII: Pupils were equal round reactive to light extraocular movements were full, visual field were full on confrontational test. Facial sensation and strength were normal. hearing was intact to finger rubbing bilaterally. Uvula tongue midline. head turning and shoulder shrug were normal and symmetric.Tongue protrusion into cheek strength was normal. Motor: normal bulk and tone, full strength in the BUE, BLE, fine finger movements normal, no pronator drift. No focal weakness Coordination: finger-nose-finger, heel-to-shin bilaterally, no dysmetria Reflexes: Brachioradialis 2/2, biceps 2/2, triceps 2/2, patellar 2/2, Achilles 2/2, plantar responses were flexor bilaterally. Gait and Station: Rising up from seated position without assistance, normal stance,  moderate stride, good arm swing, smooth turning, able to perform tiptoe, and heel walking without difficulty. Tandem gait is steady  DIAGNOSTIC DATA (LABS, IMAGING, TESTING) - I reviewed patient records, labs, notes, testing and imaging myself where available.  Lab Results  Component Value Date   WBC 8.6 12/10/2012   HGB 10.9* 12/10/2012   HCT 32.9* 12/10/2012   MCV 87.3 12/10/2012   PLT 174 12/10/2012      Component Value Date/Time   NA 139 12/10/2012 0215   K 3.7 12/10/2012 0215   CL 106 12/10/2012 0215   CO2 28 12/10/2012 0215   GLUCOSE 106* 12/10/2012 0215   BUN 6 12/10/2012 0215   CREATININE 0.75 12/10/2012 0215   CALCIUM 8.2* 12/10/2012 0215   PROT 5.6* 12/10/2012 0215   ALBUMIN 3.2* 12/11/2012 1216   AST 16 12/10/2012 0215   ALT 14 12/10/2012 0215   ALKPHOS 87 12/10/2012 0215   BILITOT 0.3 12/10/2012 0215   GFRNONAA >90 12/10/2012 0215   GFRAA >90 12/10/2012 0215     ASSESSMENT AND PLAN  48 y.o. year old male  has a past medical history of seizure  disorder. Last seizure occurred 12/09/2012 after missing dose of Dilantin.Last level 9.4 in Oct 2014.   Continue Dilantin 200 mg twice daily Will check Dilantin level patient has to have the order on a prescription pad Followup yearly. [name], [facility], [facility], APRN  [facility] [address], [place], [place] [address] [phone]

## 2013-06-27 NOTE — Patient Instructions (Addendum)
Continue Dilantin 200 mg twice daily Will check Dilantin level patient has to have the order on a prescription pad Followup yearly.

## 2013-06-27 NOTE — Progress Notes (Signed)
I have read the note, and I agree with the clinical assessment and plan.  Meliss Fleek KEITH   

## 2013-07-05 ENCOUNTER — Other Ambulatory Visit: Payer: Self-pay | Admitting: Neurology

## 2013-07-06 ENCOUNTER — Telehealth: Payer: Self-pay | Admitting: Nurse Practitioner

## 2013-07-06 LAB — PHENYTOIN LEVEL, TOTAL: PHENYTOIN LVL: 13.8 ug/mL (ref 10.0–20.0)

## 2013-07-06 NOTE — Telephone Encounter (Signed)
Please call patient. Dilantin level is good.

## 2013-07-09 NOTE — Telephone Encounter (Signed)
Called patient and left a message that Dilantin level was good. Any questions to call the office back with any concerns.

## 2013-09-19 ENCOUNTER — Telehealth: Payer: Self-pay | Admitting: Neurology

## 2013-09-19 MED ORDER — PHENYTOIN SODIUM EXTENDED 100 MG PO CAPS: 200.0000 mg | ORAL_CAPSULE | Freq: Two times a day (BID) | ORAL | Status: DC

## 2013-09-19 NOTE — Telephone Encounter (Signed)
Rx has been sent  

## 2013-12-21 ENCOUNTER — Other Ambulatory Visit: Payer: Self-pay

## 2013-12-21 MED ORDER — PHENYTOIN SODIUM EXTENDED 100 MG PO CAPS: 200.0000 mg | ORAL_CAPSULE | Freq: Two times a day (BID) | ORAL | Status: DC

## 2014-04-23 ENCOUNTER — Ambulatory Visit (INDEPENDENT_AMBULATORY_CARE_PROVIDER_SITE_OTHER): Payer: 59

## 2014-04-23 ENCOUNTER — Encounter: Payer: Self-pay | Admitting: Podiatry

## 2014-04-23 ENCOUNTER — Ambulatory Visit (INDEPENDENT_AMBULATORY_CARE_PROVIDER_SITE_OTHER): Payer: 59 | Admitting: Podiatry

## 2014-04-23 VITALS — BP 132/81 | HR 73 | Resp 16

## 2014-04-23 DIAGNOSIS — M779 Enthesopathy, unspecified: Secondary | ICD-10-CM

## 2014-04-23 DIAGNOSIS — M79671 Pain in right foot: Secondary | ICD-10-CM

## 2014-04-23 DIAGNOSIS — M722 Plantar fascial fibromatosis: Secondary | ICD-10-CM

## 2014-04-23 MED ORDER — TRIAMCINOLONE ACETONIDE 10 MG/ML IJ SUSP
10.0000 mg | Freq: Once | INTRAMUSCULAR | Status: AC
  Administered 2014-04-23: 10 mg

## 2014-04-23 MED ORDER — DICLOFENAC SODIUM 75 MG PO TBEC: 75.0000 mg | DELAYED_RELEASE_TABLET | Freq: Two times a day (BID) | ORAL | Status: DC

## 2014-04-23 NOTE — Patient Instructions (Signed)

## 2014-04-23 NOTE — Progress Notes (Signed)
   Subjective:    Patient ID: Seth Liu, male    DOB: Dec 29, 1965, 48 y.o.   MRN: [id number]  HPI Comments: "I have some pain in this foot"  Patient c/o aching plantar forefoot and arch right foot for few days. AM pain and usually throughout the day. He had this problem years ago and doc told him that the tendon was torn in the arch. He gets cramping as well. No home treatment.     Review of Systems  Respiratory: Positive for cough, chest tightness and wheezing.   Neurological: Positive for seizures.  All other systems reviewed and are negative.      Objective:   Physical Exam        Assessment & Plan:

## 2014-04-23 NOTE — Progress Notes (Signed)
Subjective:     Patient ID: Seth Liu, male   DOB: 12-23-1965, 48 y.o.   MRN: [id number]  HPI patient states that on my right foot I'm having pain and achiness in the mid foot area and it's been going on for a few days and I have had several different events of this and it makes it hard for me to walk or work   Review of Systems  All other systems reviewed and are negative.      Objective:   Physical Exam  Constitutional: He is oriented to person, place, and time.  Cardiovascular: Intact distal pulses.   Musculoskeletal: Normal range of motion.  Neurological: He is oriented to person, place, and time.  Skin: Skin is warm.  Nursing note and vitals reviewed.  neurovascular status found to be intact with muscle strength adequate and range of motion of the subtalar and midtarsal joint within normal limits. Patient is noted to have mild equinus is noted to have good digital perfusion and is well oriented 3. I noted there to be discomfort in the mid arch area right with inflammation and fluid buildup with no indications of tear of the tendon     Assessment:     Plantar fasciitis right with inflammation of the mid arch area with no indication of tear    Plan:     H&P and x-rays reviewed and today I did a mid arch injection 3 mg Kenalog 5 mg Xylocaine and applied fascial taping. I placed on diclofenac 75 mg twice a day gave instructions on physical therapy and discussed long-term orthotics to control the symptoms that he is developing. Reappoint in 1 week for reevaluation

## 2014-05-02 ENCOUNTER — Ambulatory Visit (INDEPENDENT_AMBULATORY_CARE_PROVIDER_SITE_OTHER): Payer: 59 | Admitting: Podiatry

## 2014-05-02 ENCOUNTER — Encounter: Payer: Self-pay | Admitting: Podiatry

## 2014-05-02 VITALS — BP 130/78 | HR 69 | Resp 16

## 2014-05-02 DIAGNOSIS — M722 Plantar fascial fibromatosis: Secondary | ICD-10-CM

## 2014-05-02 NOTE — Progress Notes (Signed)
Subjective:     Patient ID: Seth Liu, male   DOB: Jul 06, 1965, 48 y.o.   MRN: [id number]  HPI patient states that the heel is still hurting but improved from previous visit   Review of Systems     Objective:   Physical Exam Neurovascular status is intact with muscle strength adequate and range of motion within normal limits. Does have discomfort in the plantar aspect right heel still present but improved from previous visit    Assessment:     Improved plantar fasciitis right    Plan:     H&P and condition discussed. I have recommended orthotics to try to reduce the stress against the heel and arch and patient is scanned currently for permanent type orthotic devices and will be seen when they are ready

## 2014-05-29 ENCOUNTER — Ambulatory Visit: Payer: 59 | Admitting: *Deleted

## 2014-05-29 DIAGNOSIS — M722 Plantar fascial fibromatosis: Secondary | ICD-10-CM

## 2014-05-29 NOTE — Patient Instructions (Signed)

## 2014-05-29 NOTE — Progress Notes (Signed)
PATIENT PRESENTS FOR ORTHOTIC PICK UP

## 2014-06-27 ENCOUNTER — Telehealth: Payer: Self-pay | Admitting: Podiatry

## 2014-07-03 ENCOUNTER — Ambulatory Visit: Payer: 59 | Admitting: Nurse Practitioner

## 2014-07-08 ENCOUNTER — Ambulatory Visit: Payer: 59 | Admitting: Nurse Practitioner

## 2014-07-09 ENCOUNTER — Telehealth: Payer: Self-pay

## 2014-07-09 NOTE — Telephone Encounter (Signed)
Called patient and left him a message to call office back so we could get him rescheduled from snow day. 07-08-14 with Hoyle Sauer. CM/Willis.

## 2014-07-10 ENCOUNTER — Telehealth: Payer: Self-pay

## 2014-07-10 NOTE — Telephone Encounter (Signed)
Spoke to patient on 07/08/14 (snow day). Requested to be placed on the scheduled for Fri (07/12/14) due to work schedule. He said he would call and cancel if he is unable to make Fri.

## 2014-07-12 ENCOUNTER — Encounter: Payer: Self-pay | Admitting: Nurse Practitioner

## 2014-07-12 ENCOUNTER — Ambulatory Visit (INDEPENDENT_AMBULATORY_CARE_PROVIDER_SITE_OTHER): Payer: 59 | Admitting: Nurse Practitioner

## 2014-07-12 VITALS — BP 118/76 | HR 69 | Temp 97.8°F | Ht 66.25 in | Wt 174.8 lb

## 2014-07-12 DIAGNOSIS — G40309 Generalized idiopathic epilepsy and epileptic syndromes, not intractable, without status epilepticus: Secondary | ICD-10-CM

## 2014-07-12 MED ORDER — PHENYTOIN SODIUM EXTENDED 100 MG PO CAPS: 400.0000 mg | ORAL_CAPSULE | Freq: Every day | ORAL | Status: DC

## 2014-07-12 NOTE — Progress Notes (Signed)
[facility]  PATIENT: Seth Liu DOB: December 07, 1965   REASON FOR VISIT: Follow-up for seizure disorder  HISTORY FROM: Patient    HISTORY OF PRESENT ILLNESS:.Seth Liu, 49 year old white male returns for followup. He was last seen in this office 06/27/13.  He has a history of seizure disorder with last seizure occurring on 12/09/2012. He had 2 generalized seizures at that time went home and another witnessed in the hospital,  he had a Dilantin level of less than 2.5. He had missed a Dilantin dose. He is currently taking 200 mg twice daily without further seizure events. He is back to driving. He denies side effects to the medication, no balance issues no falls no problems with his gums. He returns for reevaluation. He needs labs to monitor the side effects of Dilantin as well as refills.    HISTORY:of a seizure disorder. The patient has been well controlled on Dilantin, and typically he runs Dilantin levels anywhere from 4.5 to around 9, and does well with this. Around 12/09/2012, the patient had at least 2 generalized seizures. One occurred at home, and another was witnessed in the hospital. The patient had a Dilantin level of less than 2.5. The patient had missed the Dilantin dose the day prior, and he had just taken his medications within moments prior to the seizure. The patient indicates that he has been working 2 jobs, and he has been getting minimal sleep. The patient works as a Forensic scientist, and his job is to operate a Teacher, music throughout the day. The patient returns for an evaluation. The patient did not suffer an injury with the seizure.   REVIEW OF SYSTEMS: Full 14 system review of systems performed and notable only for those listed, all others are neg:  Constitutional: neg  Cardiovascular: neg Ear/Nose/Throat: neg  Skin: neg Eyes: neg Respiratory: neg Gastroitestinal: neg  Hematology/Lymphatic: neg  Endocrine: neg Musculoskeletal:neg Allergy/Immunology:  neg Neurological: neg Psychiatric: neg Sleep : neg   ALLERGIES: No Known Allergies  HOME MEDICATIONS: Outpatient Prescriptions Prior to Visit  Medication Sig Dispense Refill  . bisoprolol-hydrochlorothiazide (ZIAC) 10-6.25 MG per tablet Take 1 tablet by mouth daily.    . Calcium Carbonate-Vitamin D (CALCIUM + D PO) Take 1 tablet by mouth daily.    Marland Kitchen dexlansoprazole (DEXILANT) 60 MG capsule Take 60 mg by mouth daily as needed (Acid reflux).     Marland Kitchen lisinopril (PRINIVIL,ZESTRIL) 20 MG tablet Take 20 mg by mouth daily.    . Multiple Vitamins-Minerals (MULTIVITAMIN PO) Take 1 tablet by mouth daily.    . phenytoin (DILANTIN) 100 MG ER capsule Take 2 capsules (200 mg total) by mouth 2 (two) times daily. (Patient taking differently: Take 400 mg by mouth daily. ) 360 capsule 1  . diclofenac (VOLTAREN) 75 MG EC tablet Take 1 tablet (75 mg total) by mouth 2 (two) times daily. (Patient not taking: Reported on 07/12/2014) 50 tablet 2   No facility-administered medications prior to visit.    PAST MEDICAL HISTORY: Past Medical History  Diagnosis Date  . Cancer february 1990    melanoma stage 3 left shoulder blade  . Seizures   . Hypertension   . History of headache   . History of renal calculi   . Headache(784.0)   . Closed left arm fracture     PAST SURGICAL HISTORY: Past Surgical History  Procedure Laterality Date  . Fracture surgery    . Shoulder surgery      Melanoma rexected    FAMILY HISTORY:  Family History  Problem Relation Age of Onset  . Depression Mother   . Breast cancer Mother     SOCIAL HISTORY: History   Social History  . Marital Status: Married    Spouse Name: N/A  . Number of Children: 0  . Years of Education: 12   Occupational History  . Service Representative    Social History Main Topics  . Smoking status: Never Smoker   . Smokeless tobacco: Never Used  . Alcohol Use: Yes     Comment: 2 beers year  . Drug Use: No  . Sexual Activity: Yes   Other  Topics Concern  . Not on file   Social History Narrative     PHYSICAL EXAM  Filed Vitals:   07/12/14 0905  BP: 118/76  Pulse: 69  Temp: 97.8 F (36.6 C)  TempSrc: Oral  Height: 5' 6.25" (1.683 m)  Weight: 174 lb 12.8 oz (79.289 kg)   Body mass index is 27.99 kg/(m^2). Generalized: Well developed, in no acute distress  Neurological examination   Mentation: Alert oriented to time, place, history taking. Follows all commands speech and language fluent  Cranial nerve II-XII: Pupils were equal round reactive to light extraocular movements were full, visual field were full on confrontational test. Facial sensation and strength were normal. hearing was intact to finger rubbing bilaterally. Uvula tongue midline. head turning and shoulder shrug were normal and symmetric.Tongue protrusion into cheek strength was normal. Motor: normal bulk and tone, full strength in the BUE, BLE, fine finger movements normal, no pronator drift. No focal weakness Coordination: finger-nose-finger, heel-to-shin bilaterally, no dysmetria Reflexes: Brachioradialis 2/2, biceps 2/2, triceps 2/2, patellar 2/2, Achilles 2/2, plantar responses were flexor bilaterally. Gait and Station: Rising up from seated position without assistance, normal stance, moderate stride, good arm swing, smooth turning, able to perform tiptoe, and heel walking without difficulty. Tandem gait is steady  DIAGNOSTIC DATA (LABS, IMAGING, TESTING)   ASSESSMENT AND PLAN  49 y.o. year old male  has a past medical history of Cancer (february 1990); Seizures; Hypertension; History of headache; here to follow-up for his seizure disorder. He is currently on Dilantin with no seizure events since last seen.  Continue Dilantin at current dose will refill Obtain CBC, CMP, and Dilantin level to monitor for adverse effects, toxicity, patient needs hand written RX Call for any seizure activity Follow-up yearly and when necessary Seth Liu, [facility], [facility], APRN  [facility] [address] [place], [place] [address] [phone] K and his history that his dad passed away

## 2014-07-12 NOTE — Progress Notes (Signed)
I have read the note, and I agree with the clinical assessment and plan.  WILLIS,CHARLES KEITH   

## 2014-07-12 NOTE — Patient Instructions (Signed)
Continue Dilantin at current dose will refill Obtain CBC, CMP, and Dilantin level Call for any seizure activity Follow-up yearly and when necessary

## 2015-03-05 ENCOUNTER — Other Ambulatory Visit: Payer: Self-pay | Admitting: Neurology

## 2015-08-29 ENCOUNTER — Other Ambulatory Visit: Payer: Self-pay | Admitting: Neurology

## 2015-09-15 ENCOUNTER — Emergency Department (HOSPITAL_COMMUNITY): Payer: 59

## 2015-09-15 ENCOUNTER — Encounter (HOSPITAL_COMMUNITY): Payer: Self-pay

## 2015-09-15 ENCOUNTER — Emergency Department (HOSPITAL_COMMUNITY)
Admission: EM | Admit: 2015-09-15 | Discharge: 2015-09-15 | Disposition: A | Discharge status: Home / Self Care | Payer: 59 | Attending: Emergency Medicine | Admitting: Emergency Medicine

## 2015-09-15 DIAGNOSIS — I10 Essential (primary) hypertension: Secondary | ICD-10-CM | POA: Diagnosis not present

## 2015-09-15 DIAGNOSIS — M5136 Other intervertebral disc degeneration, lumbar region: Secondary | ICD-10-CM

## 2015-09-15 DIAGNOSIS — Z8589 Personal history of malignant neoplasm of other organs and systems: Secondary | ICD-10-CM | POA: Diagnosis not present

## 2015-09-15 DIAGNOSIS — M5410 Radiculopathy, site unspecified: Secondary | ICD-10-CM | POA: Diagnosis not present

## 2015-09-15 DIAGNOSIS — M79604 Pain in right leg: Secondary | ICD-10-CM | POA: Diagnosis present

## 2015-09-15 MED ORDER — OXYCODONE-ACETAMINOPHEN 5-325 MG PO TABS: 1.0000 | ORAL_TABLET | ORAL | Status: DC | PRN

## 2015-09-15 MED ORDER — HYDROMORPHONE HCL 1 MG/ML IJ SOLN
1.0000 mg | Freq: Once | INTRAMUSCULAR | Status: AC
  Administered 2015-09-15: 1 mg via INTRAMUSCULAR
  Filled 2015-09-15: qty 1

## 2015-09-15 MED ORDER — HYDROCODONE-ACETAMINOPHEN 5-325 MG PO TABS: 1.0000 | ORAL_TABLET | ORAL | Status: DC | PRN

## 2015-09-15 MED ORDER — PREDNISONE 10 MG PO TABS: ORAL_TABLET | ORAL | Status: DC

## 2015-09-15 MED ORDER — ONDANSETRON 8 MG PO TBDP
8.0000 mg | ORAL_TABLET | Freq: Once | ORAL | Status: AC
  Administered 2015-09-15: 8 mg via ORAL
  Filled 2015-09-15: qty 1

## 2015-09-15 MED ORDER — OXYCODONE-ACETAMINOPHEN 5-325 MG PO TABS
2.0000 | ORAL_TABLET | Freq: Once | ORAL | Status: AC
  Administered 2015-09-15: 2 via ORAL
  Filled 2015-09-15: qty 2

## 2015-09-15 MED ORDER — HYDROCODONE-ACETAMINOPHEN 5-325 MG PO TABS
1.0000 | ORAL_TABLET | Freq: Once | ORAL | Status: DC
Start: 2015-09-15 — End: 2015-09-15
  Filled 2015-09-15: qty 1

## 2015-09-15 MED ORDER — PREDNISONE 50 MG PO TABS
60.0000 mg | ORAL_TABLET | Freq: Once | ORAL | Status: AC
  Administered 2015-09-15: 60 mg via ORAL
  Filled 2015-09-15: qty 1

## 2015-09-15 NOTE — ED Notes (Signed)
MD at bedside. 

## 2015-09-15 NOTE — ED Notes (Signed)
Patient states he was stretching a few weeks ago and "felt a pop" in posterior right thigh. Patient states pain has increased over the past few days. And unable to find a position of comfort.

## 2015-09-15 NOTE — ED Provider Notes (Signed)
Medical screening examination/treatment/procedure(s) were conducted as a shared visit with non-physician practitioner(s) and myself.  I personally evaluated the patient during the encounter.   EKG Interpretation None      Results for orders placed or performed in visit on 07/05/13  Phenytoin level, total  Result Value Ref Range   Phenytoin Lvl 13.8 10.0 - 20.0 ug/mL   Dg Knee Complete 4 Views Right  09/15/2015  CLINICAL DATA:  Patient states he was stretching a few weeks ago and "felt a pop" in posterior right thigh. Patient states pain has increased over the past few days. EXAM: RIGHT KNEE - COMPLETE 4+ VIEW COMPARISON:  None. FINDINGS: There is no evidence of fracture, dislocation, or joint effusion. There is no evidence of arthropathy or other focal bone abnormality. Soft tissues are unremarkable. IMPRESSION: Negative. Electronically Signed   By: Skipper Cliche M.D.   On: 09/15/2015 14:27   Dg Hip Unilat W Or W/o Pelvis 2-3 Views Right  09/15/2015  CLINICAL DATA:  Patient states he was stretching a few weeks ago and "felt a pop" in posterior right thigh. Patient states pain has increased over the past few days. EXAM: DG HIP (WITH OR WITHOUT PELVIS) 2-3V RIGHT COMPARISON:  None. FINDINGS: Pelvic bones are intact with mild bilateral hip arthritis. No evidence of proximal femur fracture or dislocation. IMPRESSION: No acute findings Electronically Signed   By: Skipper Cliche M.D.   On: 09/15/2015 14:28    Patient's x-rays without any acute findings. Patient seen by me along with the physician assistant. Patient's symptoms may be somewhat of an atypical presentation of sciatica. Patient's had a little problems with sciatica in the past nothing quite this severe. All the pain is posterior leg has pain in the calf pain in the distal 5 and occasionally up in the hip area no real back pain has noted a little bit of numbness intermittently on the bottom of the right foot at the base of the toes. Taking  this could be a presentation of sciatica does not sound like a pulled muscle. Obvious is no bony injuries based on the x-rays. Recommending MRI of the lumbar spine area as well as since he's here during the daytime Doppler studies just on the off chance to rule out any blood clot although pain seems to be way too severe for that. There is no swelling in the ankles but will get that done to rule out any DVT problems.  Fredia Sorrow, MD 09/15/15 [phone]

## 2015-09-15 NOTE — ED Provider Notes (Signed)
CSN: [id number]     Arrival date & time 09/15/15  1144 History  By signing my name below, I, Seth Liu, attest that this documentation has been prepared under the direction and in the presence of [name], PA-C. Electronically Signed: [name], ED Scribe. 09/15/2015. 1:13 PM.   Chief Complaint  Patient presents with  . Leg Pain   Patient is a 50 y.o. male presenting with leg pain. The history is provided by the patient. No language interpreter was used.  Leg Pain Location:  Leg Time since incident:  2 weeks Injury: no   Leg location:  R upper leg Pain details:    Quality:  Unable to specify   Radiates to: right lower leg. right buttocks.   Severity:  Moderate   Onset quality:  Sudden   Duration:  2 weeks   Timing:  Intermittent   Progression:  Worsening Chronicity:  New Foreign body present:  No foreign bodies Relieved by:  Heat and ice Worsened by:  Activity and rotation Associated symptoms: no fever, no muscle weakness, no numbness, no swelling and no tingling      HPI Comments: Seth Liu is a 50 y.o. male who presents to the Emergency Department complaining of sudden onset, intermittent, pulling, right posterior thigh pain x 2 weeks, worsening over the past 4 days. No known injury to the leg but pt states he was stretching in bed when he felt a pop in his thigh, causing the pain. The pain is exacerbated with movement and rest. He now complains of pain radiating down to the right calf pain and up to the right buttock pain which he attributes to overcompensating on his left leg while walking. Pt has applied ice and heat with mild relief. Denies nausea, vomiting, abdominal pain, dysuria, hematuria, fever, weakness, numbness, tingling, or any other associated symptoms.    Past Medical History  Diagnosis Date  . Cancer [facility]) february 1990    melanoma stage 3 left shoulder blade  . Seizures ([place])   . Hypertension   . History of headache   . History of renal calculi    . Headache(784.0)   . Closed left arm fracture    Past Surgical History  Procedure Laterality Date  . Fracture surgery    . Shoulder surgery      Melanoma rexected   Family History  Problem Relation Age of Onset  . Depression Mother   . Breast cancer Mother   . Throat cancer Father    Social History  Substance Use Topics  . Smoking status: Never Smoker   . Smokeless tobacco: Never Used  . Alcohol Use: Yes     Comment: 2 beers year    Review of Systems  Constitutional: Negative for fever.  Gastrointestinal: Negative for nausea, vomiting and abdominal pain.  Genitourinary: Negative for dysuria and hematuria.  Musculoskeletal: Positive for arthralgias (right posterior thigh). Negative for myalgias.  Neurological: Negative for weakness and numbness.   Allergies  Review of patient's allergies indicates no known allergies.  Home Medications   Prior to Admission medications   Medication Sig Start Date End Date Taking? Authorizing Provider  bisoprolol-hydrochlorothiazide (ZIAC) 10-6.25 MG per tablet Take 1 tablet by mouth daily.    Historical Provider, MD  Calcium Carbonate-Vitamin D (CALCIUM + D PO) Take 1 tablet by mouth daily.    Historical Provider, MD  dexlansoprazole (DEXILANT) 60 MG capsule Take 60 mg by mouth daily as needed (Acid reflux).     Historical Provider,  MD  diclofenac (VOLTAREN) 75 MG EC tablet Take 1 tablet (75 mg total) by mouth 2 (two) times daily. Patient not taking: Reported on 07/12/2014 04/23/14   [name], DPM  lisinopril (PRINIVIL,ZESTRIL) 20 MG tablet Take 20 mg by mouth daily.    Historical Provider, MD  Multiple Vitamins-Minerals (MULTIVITAMIN PO) Take 1 tablet by mouth daily.    Historical Provider, MD  phenytoin (DILANTIN) 100 MG ER capsule Take 4 capsules (400 mg total) by mouth daily. 07/12/14   [name], NP  phenytoin (DILANTIN) 100 MG ER capsule TAKE 4 CAPSULES (400 MG TOTAL) BY MOUTH DAILY. 09/01/15   [name], MD    BP 106/58 mmHg  Pulse 56  Temp(Src) 97.8 F (36.6 C) (Temporal)  Resp 16  Ht 5\' 5"  (1.651 m)  Wt 78.019 kg  BMI 28.62 kg/m2  SpO2 98%   Physical Exam  Constitutional: He appears well-developed and well-nourished.  HENT:  Head: Normocephalic.  Eyes: Conjunctivae are normal.  Neck: Normal range of motion. Neck supple.  Cardiovascular: Normal rate and intact distal pulses.   Pulses:      Dorsalis pedis pulses are 2+ on the right side, and 2+ on the left side.  Pedal pulses normal.  Pulmonary/Chest: Effort normal.  Abdominal: Soft. Bowel sounds are normal. He exhibits no distension and no mass. There is no tenderness.  Musculoskeletal: Normal range of motion. He exhibits tenderness. He exhibits no edema.       Lumbar back: He exhibits no bony tenderness, no swelling, no edema and no spasm.  TTP along the right mid buttocks through the hip and down the right posterior thigh to the knee. There is no edema or erythema. No bruising. No spasm or palpable deformity. No appreciable cords. Knee joint is non tender. DP pulses are 2+ bilaterally. Calf is tender but soft. No rash.   Neurological: He is alert. He has normal strength. He displays no atrophy and no tremor. No sensory deficit. Gait normal.  Reflex Scores:      Patellar reflexes are 2+ on the right side and 2+ on the left side.      Achilles reflexes are 2+ on the right side and 2+ on the left side. No strength deficit noted in hip and knee flexor and extensor muscle groups.  Ankle flexion and extension intact.  Skin: Skin is warm and dry.  Psychiatric: He has a normal mood and affect.  Nursing note and vitals reviewed.   ED Course  Procedures (including critical care time)  DIAGNOSTIC STUDIES: Oxygen Saturation is 96% on RA, normal by my interpretation.    COORDINATION OF CARE: 1:01 PM-Discussed treatment plan with pt at bedside and pt agreed to plan.   Labs Review Labs Reviewed - No data to display  Imaging  Review Mr Lumbar Spine Wo Contrast  09/15/2015  CLINICAL DATA:  Right lower extremity radicular pain. Felt a pop in the posterior right thigh/ buttock 1 week ago, with increased pain in the past few days. EXAM: MRI LUMBAR SPINE WITHOUT CONTRAST TECHNIQUE: Multiplanar, multisequence MR imaging of the lumbar spine was performed. No intravenous contrast was administered. COMPARISON:  CT abdomen and pelvis 12/20/2010. Chest radiographs 12/09/2012. FINDINGS: The examination is moderately motion degraded, particularly axial series. The comparison CT demonstrates transitional lumbosacral anatomy. Twelve pairs of ribs are demonstrated on the chest radiographs. 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 There is incomplete segmentation at L5-S1 with complete fusion of the posterior elements as  well as partial fusion across the posterior disc space with a hypoplastic disc. There is a full sized disc at S1-2. Vertebral alignment is within normal limits. Vertebral body heights are preserved. There is disc desiccation at L3-4, L4-5, and S1-2 without significant disc space height loss. No significant vertebral marrow edema is seen. The conus medullaris is normal in signal and terminates at L1. Paraspinal soft tissues are grossly unremarkable. L1-2:  Only imaged sagittally.  Negative. L2-3:  Negative. L3-4:  Mild disc bulging without significant stenosis. L4-5: Disc bulging asymmetric to the right may result in mild right lateral recess stenosis, with assessment limited by motion artifact. No spinal canal or significant neural foraminal stenosis. L5-S1: Segmentation anomaly with hypoplastic disc as above. Hypoplastic right S1 pedicle. No significant stenosis. S1-2: Broad central disc protrusion results in at most minimal lateral recess narrowing. No mass effect on the right S1 nerve root. No spinal or neural foraminal stenosis. IMPRESSION: 1. Moderately motion degraded examination with transitional lumbosacral  anatomy/segmentation anomaly as above. 2. L4-5 disc bulging with likely mild right lateral recess stenosis, potentially irritating the right L5 nerve root. 3. S1-2 disc protrusion without evidence of neural impingement. Electronically Signed   By: Logan Bores M.D.   On: 09/15/2015 16:56   US Venous Img Lower Unilateral Right  09/15/2015  CLINICAL DATA:  50 year old male with a history of right lower extremity pain EXAM: RIGHT LOWER EXTREMITY VENOUS DOPPLER ULTRASOUND TECHNIQUE: Gray-scale sonography with graded compression, as well as color Doppler and duplex ultrasound were performed to evaluate the lower extremity deep venous systems from the level of the common femoral vein and including the common femoral, femoral, profunda femoral, popliteal and calf veins including the posterior tibial, peroneal and gastrocnemius veins when visible. The superficial great saphenous vein was also interrogated. Spectral Doppler was utilized to evaluate flow at rest and with distal augmentation maneuvers in the common femoral, femoral and popliteal veins. COMPARISON:  None. FINDINGS: Contralateral Common Femoral Vein: Respiratory phasicity is normal and symmetric with the symptomatic side. No evidence of thrombus. Normal compressibility. Common Femoral Vein: No evidence of thrombus. Normal compressibility, respiratory phasicity and response to augmentation. Saphenofemoral Junction: No evidence of thrombus. Normal compressibility and flow on color Doppler imaging. Profunda Femoral Vein: No evidence of thrombus. Normal compressibility and flow on color Doppler imaging. Femoral Vein: No evidence of thrombus. Normal compressibility, respiratory phasicity and response to augmentation. Popliteal Vein: No evidence of thrombus. Normal compressibility, respiratory phasicity and response to augmentation. Calf Veins: No evidence of thrombus. Normal compressibility and flow on color Doppler imaging. Superficial Great Saphenous Vein: No  evidence of thrombus. Normal compressibility and flow on color Doppler imaging. Other Findings:  None. IMPRESSION: Sonographic survey of the right lower extremity negative for DVT. Signed, Dulcy Fanny. Earleen Newport, DO [facility] Electronically Signed   By: Corrie Mckusick D.O.   On: 09/15/2015 16:03   Dg Knee Complete 4 Views Right  09/15/2015  CLINICAL DATA:  Patient states he was stretching a few weeks ago and "felt a pop" in posterior right thigh. Patient states pain has increased over the past few days. EXAM: RIGHT KNEE - COMPLETE 4+ VIEW COMPARISON:  None. FINDINGS: There is no evidence of fracture, dislocation, or joint effusion. There is no evidence of arthropathy or other focal bone abnormality. Soft tissues are unremarkable. IMPRESSION: Negative. Electronically Signed   By: Skipper Cliche M.D.   On: 09/15/2015 14:27   Dg Hip Unilat W Or W/o  Pelvis 2-3 Views Right  09/15/2015  CLINICAL DATA:  Patient states he was stretching a few weeks ago and "felt a pop" in posterior right thigh. Patient states pain has increased over the past few days. EXAM: DG HIP (WITH OR WITHOUT PELVIS) 2-3V RIGHT COMPARISON:  None. FINDINGS: Pelvic bones are intact with mild bilateral hip arthritis. No evidence of proximal femur fracture or dislocation. IMPRESSION: No acute findings Electronically Signed   By: Skipper Cliche M.D.   On: 09/15/2015 14:28   I have personally reviewed and evaluated these images and lab results as part of my medical decision-making.   EKG Interpretation None      MDM   Final diagnoses:  Leg pain, posterior, right  Radicular pain of right lower extremity   Pt was also seen by Dr. Rogene Houston during this visit.  Recommended ultrasound of right lower extremity to r/o dvt which was negative.  Also recommended MRI lumbar to rule atypical presentation of sciatica pain.   5:08 PM MRI results with L4-5 disc bulging with likely mild right  lateral recess stenosis, potentially irritating the right L5 nerve root.  Atypical radiculopathy presentation.  Pt placed on prednisone taper, hydrocodone. Plan f/u with Dr. Ronnald Ramp, referral given.   I personally performed the services described in this documentation, which was scribed in my presence. The recorded information has been reviewed and is accurate.     [name], PA-C 09/15/15 1709

## 2015-09-15 NOTE — Discharge Instructions (Signed)
Degenerative Disk Disease  Degenerative disk disease is a condition caused by the changes that occur in spinal disks as you grow older. Spinal disks are soft and compressible disks located between the bones of your spine (vertebrae). These disks act like shock absorbers. Degenerative disk disease can affect the whole spine. However, the neck and lower back are most commonly affected. Many changes can occur in the spinal disks with aging, such as:  · The spinal disks may dry and shrink.  · Small tears may occur in the tough, outer covering of the disk (annulus).  · The disk space may become smaller due to loss of water.  · Abnormal growths in the bone (spurs) may occur. This can put pressure on the nerve roots exiting the spinal canal, causing pain.  · The spinal canal may become narrowed.  RISK FACTORS   · Being overweight.  · Having a family history of degenerative disk disease.  · Smoking.  · There is increased risk if you are doing heavy lifting or have a sudden injury.  SIGNS AND SYMPTOMS   Symptoms vary from person to person and may include:  · Pain that varies in intensity. Some people have no pain, while others have severe pain. The location of the pain depends on the part of your backbone that is affected.  ¨ You will have neck or arm pain if a disk in the neck area is affected.  ¨ You will have pain in your back, buttocks, or legs if a disk in the lower back is affected.  · Pain that becomes worse while bending, reaching up, or with twisting movements.  · Pain that may start gradually and then get worse as time passes. It may also start after a major or minor injury.  · Numbness or tingling in the arms or legs.  DIAGNOSIS   Your health care provider will ask you about your symptoms and about activities or habits that may cause the pain. He or she may also ask about any injuries, diseases, or treatments you have had. Your health care provider will examine you to check for the range of movement that is  possible in the affected area, to check for strength in your extremities, and to check for sensation in the areas of the arms and legs supplied by different nerve roots. You may also have:   · An X-ray of the spine.  · Other imaging tests, such as MRI.  TREATMENT   Your health care provider will advise you on the best plan for treatment. Treatment may include:  · Medicines.  · Rehabilitation exercises.  HOME CARE INSTRUCTIONS   · Follow proper lifting and walking techniques as advised by your health care provider.  · Maintain good posture.  · Exercise regularly as advised by your health care provider.  · Perform relaxation exercises.  · Change your sitting, standing, and sleeping habits as advised by your health care provider.  · Change positions frequently.  · Lose weight or maintain a healthy weight as advised by your health care provider.  · Do not use any tobacco products, including cigarettes, chewing tobacco, or electronic cigarettes. If you need help quitting, ask your health care provider.  · Wear supportive footwear.  · Take medicines only as directed by your health care provider.  SEEK MEDICAL CARE IF:   · Your pain does not go away within 1-4 weeks.  · You have significant appetite or weight loss.  SEEK IMMEDIATE MEDICAL CARE IF:   ·   instructions.  Will watch your condition.  Will get help right away if you are not doing well or get worse.   This information is not intended to replace advice given to you by your health care provider. Make sure you discuss any questions you have with your health care provider.   Document Released: 03/07/2007 Document Revised: 05/31/2014 Document Reviewed: 09/11/2013 Elsevier Interactive Patient Education 2016 [place] your next dose of  prednisone tomorrow morning.  Use the the other medicines as directed.  Do not drive within 4 hours of taking oxycodone as this will make you drowsy.  Avoid lifting,  Bending,  Twisting or any other activity that worsens your pain over the next week.  Apply an  icepack  to your lower back for 10-15 minutes every 2 hours for the next 2 days.  You should get rechecked if your symptoms are not better over the next 5 days,  Or you develop increased pain,  Weakness in your leg(s) or loss of bladder or bowel function - these are symptoms of a worse injury.

## 2015-11-22 DIAGNOSIS — M5126 Other intervertebral disc displacement, lumbar region: Secondary | ICD-10-CM

## 2015-11-22 HISTORY — DX: Other intervertebral disc displacement, lumbar region: M51.26

## 2015-11-29 ENCOUNTER — Other Ambulatory Visit: Payer: Self-pay | Admitting: Neurology

## 2015-12-08 ENCOUNTER — Encounter: Payer: Self-pay | Admitting: Nurse Practitioner

## 2015-12-08 ENCOUNTER — Ambulatory Visit (INDEPENDENT_AMBULATORY_CARE_PROVIDER_SITE_OTHER): Payer: 59 | Admitting: Nurse Practitioner

## 2015-12-08 VITALS — BP 117/77 | HR 58 | Ht 67.0 in | Wt 173.2 lb

## 2015-12-08 DIAGNOSIS — G40309 Generalized idiopathic epilepsy and epileptic syndromes, not intractable, without status epilepticus: Secondary | ICD-10-CM | POA: Diagnosis not present

## 2015-12-08 DIAGNOSIS — Z5181 Encounter for therapeutic drug level monitoring: Secondary | ICD-10-CM

## 2015-12-08 MED ORDER — PHENYTOIN SODIUM EXTENDED 100 MG PO CAPS: 400.0000 mg | ORAL_CAPSULE | Freq: Every day | ORAL | Status: DC

## 2015-12-08 NOTE — Patient Instructions (Signed)
Continue Dilantin at current dose will refill Obtain CBC, CMP, and Dilantin level to monitor for adverse effects, toxicity,  Call for any seizure activity Follow-up yearly and when necessary  

## 2015-12-08 NOTE — Progress Notes (Signed)
[facility]  PATIENT: Seth Liu DOB: Feb 28, 1966   REASON FOR VISIT: Follow-up for generalized convulsive epilepsy HISTORY FROM:patient    HISTORY OF PRESENT ILLNESS:Seth Liu, [birth year]. -year-old white male returns for followup. He was last seen in this office 07/12/2014.  He has a history of seizure disorder with last seizure occurring on 12/09/2012. He had 2 generalized seizures at that time went home and another witnessed in the hospital, he had a Dilantin level of less than 2.5. He had missed a Dilantin dose. He is currently taking 200 mg twice daily without further seizure events. He is  driving without difficulty. He denies side effects to the medication, no balance issues no falls no problems with his gums. He returns for reevaluation. He needs labs to monitor the side effects of Dilantin as well as refills.    HISTORY:of a seizure disorder. The patient has been well controlled on Dilantin, and typically he runs Dilantin levels anywhere from 4.5 to around 9, and does well with this. Around 12/09/2012, the patient had at least 2 generalized seizures. One occurred at home, and another was witnessed in the hospital. The patient had a Dilantin level of less than 2.5. The patient had missed the Dilantin dose the day prior, and he had just taken his medications within moments prior to the seizure. The patient indicates that he has been working 2 jobs, and he has been getting minimal sleep. The patient works as a Forensic scientist, and his job is to operate a Teacher, music throughout the day. The patient returns for an evaluation. The patient did not suffer an injury with the seizure.    REVIEW OF SYSTEMS: Full 14 system review of systems performed and notable only for those listed, all others are neg:  Constitutional: neg  Cardiovascular: neg Ear/Nose/Throat: neg  Skin: neg Eyes: neg Respiratory: neg Gastroitestinal: neg  Hematology/Lymphatic: neg  Endocrine:  neg Musculoskeletal:back pain receiving epidurals Allergy/Immunology: neg Neurological: neg Psychiatric: neg Sleep : neg   ALLERGIES: No Known Allergies  HOME MEDICATIONS: Outpatient Prescriptions Prior to Visit  Medication Sig Dispense Refill  . bisoprolol-hydrochlorothiazide (ZIAC) 10-6.25 MG per tablet Take 1 tablet by mouth daily.    . Calcium Carbonate-Vitamin D (CALCIUM + D PO) Take 1 tablet by mouth daily.    Marland Kitchen dexlansoprazole (DEXILANT) 60 MG capsule Take 60 mg by mouth daily as needed (Acid reflux).     Marland Kitchen diclofenac (VOLTAREN) 75 MG EC tablet Take 1 tablet (75 mg total) by mouth 2 (two) times daily. 50 tablet 2  . lisinopril (PRINIVIL,ZESTRIL) 20 MG tablet Take 20 mg by mouth daily.    . Multiple Vitamins-Minerals (MULTIVITAMIN PO) Take 1 tablet by mouth daily.    Marland Kitchen oxyCODONE-acetaminophen (PERCOCET/ROXICET) 5-325 MG tablet Take 1 tablet by mouth every 4 (four) hours as needed. 20 tablet 0  . phenytoin (DILANTIN) 100 MG ER capsule Take 4 capsules (400 mg total) by mouth daily. 360 capsule 3  . predniSONE (DELTASONE) 10 MG tablet 6, 5, 4, 3, 2 then 1 tablet by mouth daily for 6 days total. 21 tablet 0  . phenytoin (DILANTIN) 100 MG ER capsule TAKE 4 CAPSULES (400 MG TOTAL) BY MOUTH DAILY. 360 capsule 0   No facility-administered medications prior to visit.    PAST MEDICAL HISTORY: Past Medical History  Diagnosis Date  . Cancer [facility]) february 1990    melanoma stage 3 left shoulder blade  . Seizures ([place])   . Hypertension   . History of headache   .  History of renal calculi   . Headache(784.0)   . Closed left arm fracture   . Lumbar herniated disc 11/2015    L4-5, receiving epidural steroid injections  . Restless leg syndrome     PAST SURGICAL HISTORY: Past Surgical History  Procedure Laterality Date  . Fracture surgery    . Shoulder surgery      Melanoma rexected    FAMILY HISTORY: Family History  Problem Relation Age of Onset  . Depression Mother   .  Breast cancer Mother   . Throat cancer Father     SOCIAL HISTORY: Social History   Social History  . Marital Status: Married    Spouse Name: N/A  . Number of Children: 0  . Years of Education: 12   Occupational History  . Service Representative    Social History Main Topics  . Smoking status: Never Smoker   . Smokeless tobacco: Never Used  . Alcohol Use: Yes     Comment: 2 beers year  . Drug Use: No  . Sexual Activity: Yes   Other Topics Concern  . Not on file   Social History Narrative     PHYSICAL EXAM  Filed Vitals:   12/08/15 1010  BP: 117/77  Pulse: 58  Height: 5\' 7"  (1.702 m)  Weight: 173 lb 3.2 oz (78.563 kg)   Body mass index is 27.12 kg/(m^2). Generalized: Well developed, in no acute distress  Neurological examination   Mentation: Alert oriented to time, place, history taking. Follows all commands speech and language fluent  Cranial nerve II-XII: Pupils were equal round reactive to light extraocular movements were full, visual field were full on confrontational test. Facial sensation and strength were normal. hearing was intact to finger rubbing bilaterally. Uvula tongue midline. head turning and shoulder shrug were normal and symmetric.Tongue protrusion into cheek strength was normal. Motor: normal bulk and tone, full strength in the BUE, BLE, fine finger movements normal, no pronator drift. No focal weakness Coordination: finger-nose-finger, heel-to-shin bilaterally, no dysmetria Reflexes: Brachioradialis 2/2, biceps 2/2, triceps 2/2, patellar 2/2, Achilles 2/2, plantar responses were flexor bilaterally. Gait and Station: Rising up from seated position without assistance, normal stance, moderate stride, good arm swing, smooth turning, able to perform tiptoe, and heel walking without difficulty. Tandem gait is steady DIAGNOSTIC DATA (LABS, IMAGING, TESTING) -  ASSESSMENT AND PLAN 50 y.o. year old male has a past medical history of Cancer (february  1990); Seizures; Hypertension; History of headache; here to follow-up for his seizure disorder. He is currently on Dilantin with no seizure events since 2014. The patient is a current patient of Dr.Willis  who is out of the office today . This note is sent to the work in doctor.      Continue Dilantin at current dose will refill Obtain CBC, CMP, and Dilantin level to monitor for adverse effects, toxicity,  Call for any seizure activity Follow-up yearly and when necessary [name], [facility], [facility], [facility] [address] [place], [place] [address] [phone]

## 2015-12-08 NOTE — Progress Notes (Signed)
I agree with the assessment and plan as directed by NP .The patient is known to me .   Shruthi Northrup, MD  

## 2015-12-09 ENCOUNTER — Telehealth: Payer: Self-pay | Admitting: Nurse Practitioner

## 2015-12-09 LAB — COMPREHENSIVE METABOLIC PANEL
A/G RATIO: 2 (ref 1.2–2.2)
ALBUMIN: 4.2 g/dL (ref 3.5–5.5)
ALT: 19 IU/L (ref 0–44)
AST: 17 IU/L (ref 0–40)
Alkaline Phosphatase: 69 IU/L (ref 39–117)
BUN / CREAT RATIO: 15 (ref 9–20)
BUN: 13 mg/dL (ref 6–24)
Bilirubin Total: 0.2 mg/dL (ref 0.0–1.2)
CALCIUM: 9.4 mg/dL (ref 8.7–10.2)
CO2: 25 mmol/L (ref 18–29)
Chloride: 103 mmol/L (ref 96–106)
Creatinine, Ser: 0.86 mg/dL (ref 0.76–1.27)
GFR, EST AFRICAN AMERICAN: 117 mL/min/{1.73_m2} (ref >59)
GFR, EST NON AFRICAN AMERICAN: 101 mL/min/{1.73_m2} (ref >59)
GLOBULIN, TOTAL: 2.1 g/dL (ref 1.5–4.5)
Glucose: 95 mg/dL (ref 65–99)
POTASSIUM: 4.4 mmol/L (ref 3.5–5.2)
SODIUM: 142 mmol/L (ref 134–144)
Total Protein: 6.3 g/dL (ref 6.0–8.5)

## 2015-12-09 LAB — CBC WITH DIFFERENTIAL/PLATELET
BASOS: 1 %
Basophils Absolute: 0 10*3/uL (ref 0.0–0.2)
EOS (ABSOLUTE): 0.2 10*3/uL (ref 0.0–0.4)
EOS: 2 %
HEMATOCRIT: 37.9 % (ref 37.5–51.0)
HEMOGLOBIN: 12.7 g/dL (ref 12.6–17.7)
Immature Grans (Abs): 0 10*3/uL (ref 0.0–0.1)
Immature Granulocytes: 0 %
LYMPHS ABS: 2 10*3/uL (ref 0.7–3.1)
Lymphs: 29 %
MCH: 29.5 pg (ref 26.6–33.0)
MCHC: 33.5 g/dL (ref 31.5–35.7)
MCV: 88 fL (ref 79–97)
MONOS ABS: 0.8 10*3/uL (ref 0.1–0.9)
Monocytes: 11 %
NEUTROS ABS: 3.9 10*3/uL (ref 1.4–7.0)
Neutrophils: 57 %
Platelets: 251 10*3/uL (ref 150–379)
RBC: 4.31 x10E6/uL (ref 4.14–5.80)
RDW: 14 % (ref 12.3–15.4)
WBC: 6.8 10*3/uL (ref 3.4–10.8)

## 2015-12-09 LAB — PHENYTOIN LEVEL, TOTAL: Phenytoin (Dilantin), Serum: 6.4 ug/mL — ABNORMAL LOW (ref 10.0–20.0)

## 2015-12-09 NOTE — Telephone Encounter (Signed)
Attempted to call patient on mobile no longer working, used home number unable to leave a message. Dilantin level is a little  low please verify patient taking 400 mg daily with no missed doses.

## 2015-12-09 NOTE — Telephone Encounter (Signed)
LMVM home and pts cell to call back for lab results.

## 2015-12-09 NOTE — Telephone Encounter (Signed)
Pt called back.   I gave him the results.  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  As far as he knows he has taken 4 caps dilantin (100mg  caps) every day 3-5pm.  (no missed doses).  He will be available tomorrow morning at 0800 prior to going to sleep if we need to talk with him (mobile #).   No seizures.

## 2015-12-09 NOTE — Telephone Encounter (Signed)
noted 

## 2016-05-26 DIAGNOSIS — Z0000 Encounter for general adult medical examination without abnormal findings: Secondary | ICD-10-CM | POA: Diagnosis not present

## 2016-05-26 DIAGNOSIS — E782 Mixed hyperlipidemia: Secondary | ICD-10-CM | POA: Diagnosis not present

## 2016-05-27 DIAGNOSIS — H524 Presbyopia: Secondary | ICD-10-CM | POA: Diagnosis not present

## 2016-05-27 DIAGNOSIS — H52223 Regular astigmatism, bilateral: Secondary | ICD-10-CM | POA: Diagnosis not present

## 2016-06-11 DIAGNOSIS — M5126 Other intervertebral disc displacement, lumbar region: Secondary | ICD-10-CM | POA: Diagnosis not present

## 2016-06-11 DIAGNOSIS — K219 Gastro-esophageal reflux disease without esophagitis: Secondary | ICD-10-CM | POA: Diagnosis not present

## 2016-06-11 DIAGNOSIS — Z1211 Encounter for screening for malignant neoplasm of colon: Secondary | ICD-10-CM | POA: Diagnosis not present

## 2016-10-02 IMAGING — MR MR LUMBAR SPINE W/O CM
4 of 5 series · 16 of 48 positions shown · non-contrast
Comparison: CT abdomen and pelvis 12/20/2010. Chest radiographs
12/09/2012.

CLINICAL DATA: Right lower extremity radicular pain. Felt a pop in
the posterior right thigh/ buttock 1 week ago, with increased pain
in the past few days.

EXAM:
MRI LUMBAR SPINE WITHOUT CONTRAST
TECHNIQUE: Multiplanar, multisequence MR imaging of the lumbar spine was
performed. No intravenous contrast was administered.

[Series 3: T2 · sagittal · 4.0mm · 0.74mm/px · 6 of 15 slices shown (1 of 2)]
[im 1/15]
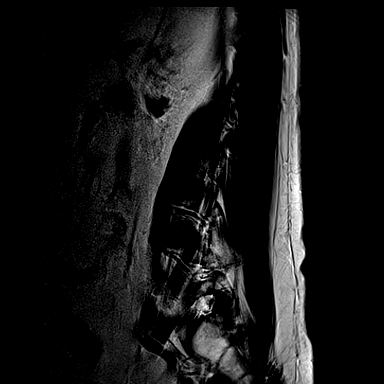
[im 3/15]
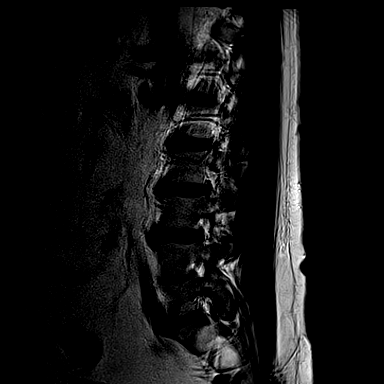
[im 6/15]
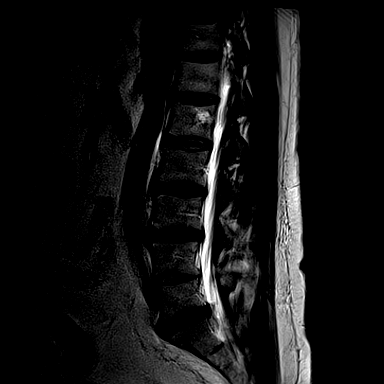
[im 9/15]
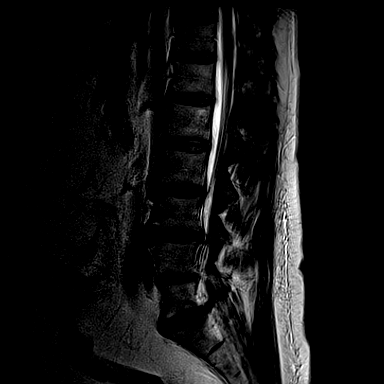
[im 12/15]
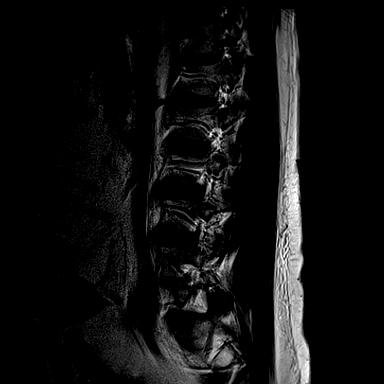
[im 15/15]
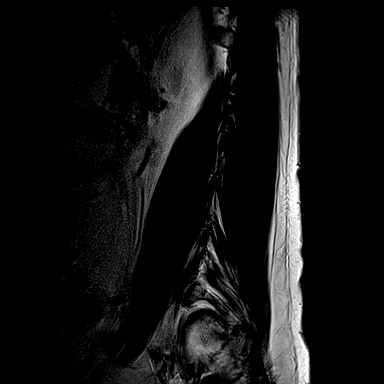

[Series 6: T2 · axial · 4.0mm · 0.21mm/px · z∈[-146,-15]mm · 4 of 33 slices shown (2 of 2)]
[im 1/33]
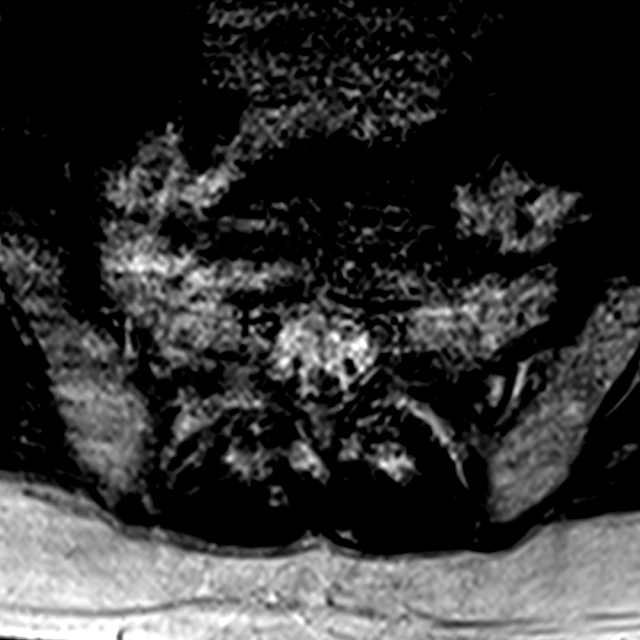
[im 6/33]
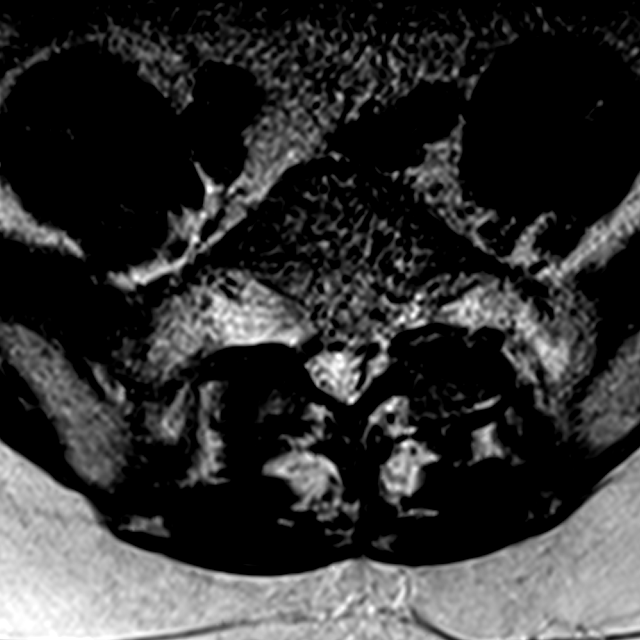
[im 17/33]
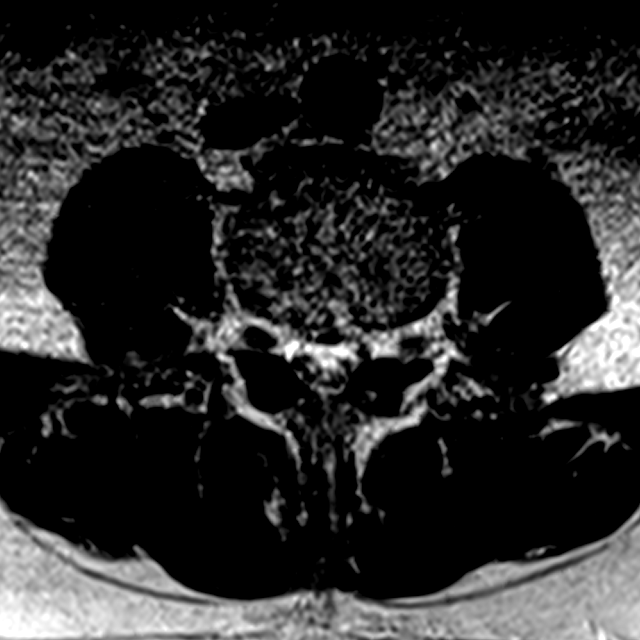
[im 27/33]
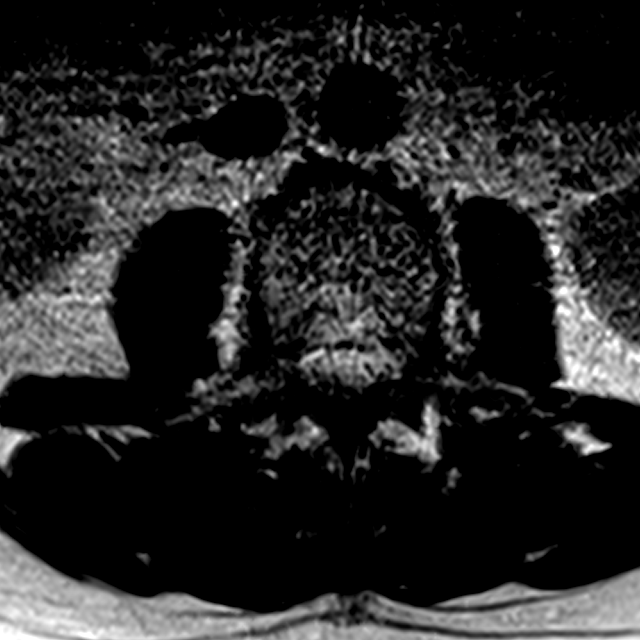

[Series 8: T1 · sagittal · 4.0mm · 0.44mm/px · 3 of 19 slices shown (1 of 2)]
[im 3/19]
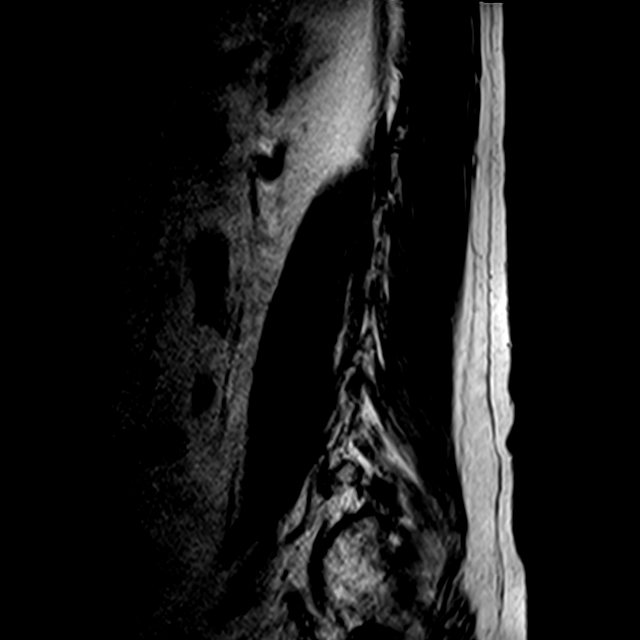
[im 11/19]
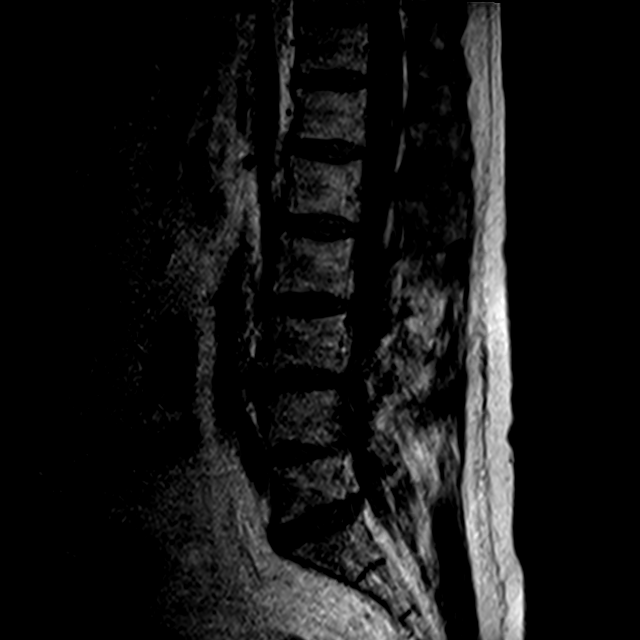
[im 16/19]
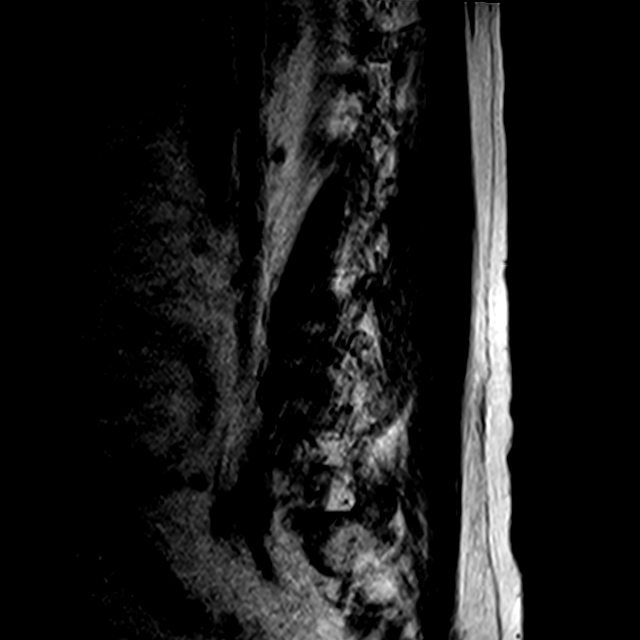

[Series 9: T1 · axial · 4.0mm · 0.24mm/px · z∈[-125,-14]mm · 3 of 33 slices shown (2 of 2)]
[im 6/33]
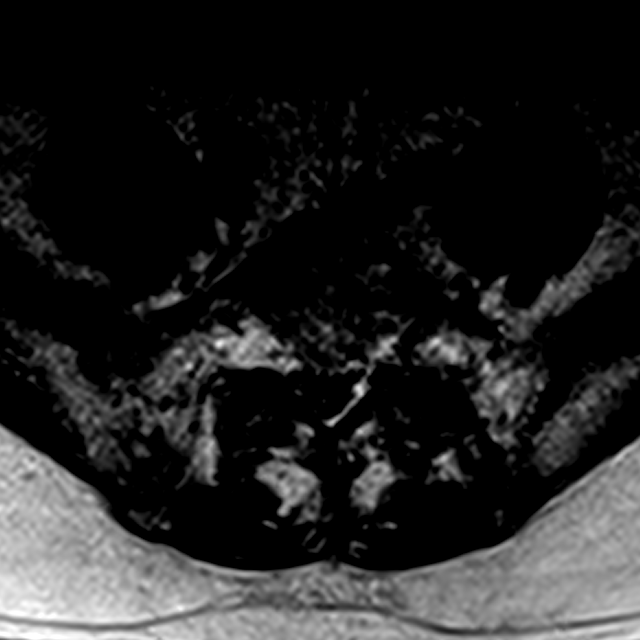
[im 17/33]
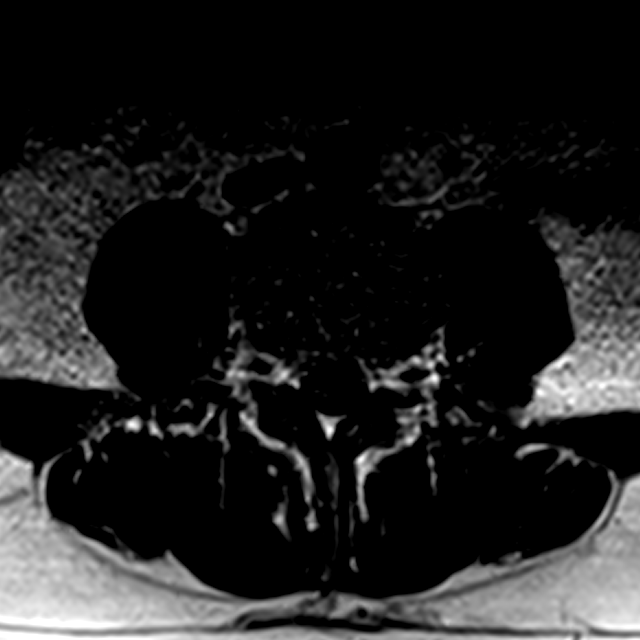
[im 27/33]
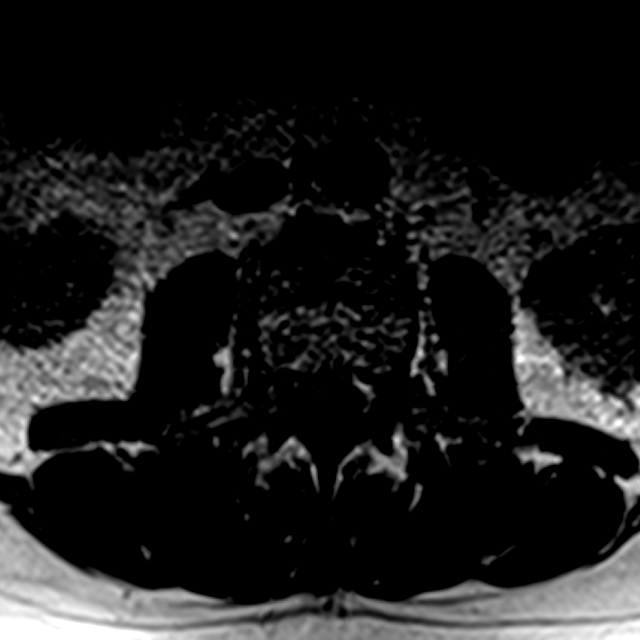

[16 of 48 positions shown; findings below may reference images not displayed]

FINDINGS: The examination is moderately motion degraded, particularly axial
series.

The comparison CT demonstrates transitional lumbosacral anatomy.
Twelve pairs of ribs are demonstrated on the chest radiographs. The
transitional segment will be considered a lumbarized S1. There is
incomplete segmentation at L5-S1 with complete fusion of the
posterior elements as well as partial fusion across the posterior
disc space with a hypoplastic disc. There is a full sized disc at
S1-2.

Vertebral alignment is within normal limits. Vertebral body heights
are preserved. There is disc desiccation at L3-4, L4-5, and S1-2
without significant disc space height loss. No significant vertebral
marrow edema is seen. The conus medullaris is normal in signal and
terminates at L1. Paraspinal soft tissues are grossly unremarkable.

L1-2:  Only imaged sagittally.  Negative.

L2-3:  Negative.

L3-4:  Mild disc bulging without significant stenosis.

L4-5: Disc bulging asymmetric to the right may result in mild right
lateral recess stenosis, with assessment limited by motion artifact.
No spinal canal or significant neural foraminal stenosis.

L5-S1: Segmentation anomaly with hypoplastic disc as above.
Hypoplastic right S1 pedicle. No significant stenosis.

S1-2: Broad central disc protrusion results in at most minimal
lateral recess narrowing. No mass effect on the right S1 nerve root.
No spinal or neural foraminal stenosis.
IMPRESSION: 1. Moderately motion degraded examination with transitional
lumbosacral anatomy/segmentation anomaly as above.
2. L4-5 disc bulging with likely mild right lateral recess stenosis,
potentially irritating the right L5 nerve root.
3. S1-2 disc protrusion without evidence of neural impingement.

## 2016-11-29 ENCOUNTER — Other Ambulatory Visit: Payer: Self-pay | Admitting: Nurse Practitioner

## 2016-12-08 ENCOUNTER — Encounter (INDEPENDENT_AMBULATORY_CARE_PROVIDER_SITE_OTHER): Payer: Self-pay

## 2016-12-08 ENCOUNTER — Ambulatory Visit (INDEPENDENT_AMBULATORY_CARE_PROVIDER_SITE_OTHER): Payer: 59 | Admitting: Nurse Practitioner

## 2016-12-08 ENCOUNTER — Encounter: Payer: Self-pay | Admitting: Nurse Practitioner

## 2016-12-08 VITALS — BP 100/72 | HR 56 | Wt 167.8 lb

## 2016-12-08 DIAGNOSIS — Z5181 Encounter for therapeutic drug level monitoring: Secondary | ICD-10-CM

## 2016-12-08 DIAGNOSIS — R569 Unspecified convulsions: Secondary | ICD-10-CM | POA: Diagnosis not present

## 2016-12-08 NOTE — Progress Notes (Signed)
I have read the note, and I agree with the clinical assessment and plan.  WILLIS,CHARLES KEITH   

## 2016-12-08 NOTE — Progress Notes (Signed)
[facility]  PATIENT: Seth Liu DOB: 06/21/1965   REASON FOR VISIT: Follow-up for generalized convulsive epilepsy HISTORY FROM:patient    HISTORY OF PRESENT ILLNESS:Mr. Seth Liu, 51 -year-old white male returns for yearly followup.   He has a history of seizure disorder with last seizure occurring on 12/09/2012. He had 2 generalized seizures at that time went home and another witnessed in the hospital, he had a Dilantin level of less than 2.5. He had missed a Dilantin dose. He is currently taking 200 mg twice daily without further seizure events. He is  driving without difficulty. He denies side effects to the medication, no balance issues no falls no problems with his gums. He returns for reevaluation. He needs labs to monitor the side effects of Dilantin as well as refills.    HISTORY:of a seizure disorder. The patient has been well controlled on Dilantin, and typically he runs Dilantin levels anywhere from 4.5 to around 9, and does well with this. Around 12/09/2012, the patient had at least 2 generalized seizures. One occurred at home, and another was witnessed in the hospital. The patient had a Dilantin level of less than 2.5. The patient had missed the Dilantin dose the day prior, and he had just taken his medications within moments prior to the seizure. The patient indicates that he has been working 2 jobs, and he has been getting minimal sleep. The patient works as a Forensic scientist, and his job is to operate a Teacher, music throughout the day. The patient returns for an evaluation. The patient did not suffer an injury with the seizure.    REVIEW OF SYSTEMS: Full 14 system review of systems performed and notable only for those listed, all others are neg:  Constitutional: neg  Cardiovascular: neg Ear/Nose/Throat: neg  Skin: neg Eyes: neg Respiratory: neg Gastroitestinal: neg  Hematology/Lymphatic: neg  Endocrine: neg Musculoskeletal:neg Allergy/Immunology: Seasonal  allergies Neurological: History of seizure disorder Psychiatric: neg Sleep : neg   ALLERGIES: No Known Allergies  HOME MEDICATIONS: Outpatient Medications Prior to Visit  Medication Sig Dispense Refill  . bisoprolol-hydrochlorothiazide (ZIAC) 10-6.25 MG per tablet Take 1 tablet by mouth daily.    . Calcium Carbonate-Vitamin D (CALCIUM + D PO) Take 1 tablet by mouth daily.    Marland Kitchen dexlansoprazole (DEXILANT) 60 MG capsule Take 60 mg by mouth daily as needed (Acid reflux).     Marland Kitchen lisinopril (PRINIVIL,ZESTRIL) 20 MG tablet Take 20 mg by mouth daily.    . Multiple Vitamins-Minerals (MULTIVITAMIN PO) Take 1 tablet by mouth daily.    . phenytoin (DILANTIN) 100 MG ER capsule TAKE 4 CAPSULES (400 MG TOTAL) BY MOUTH DAILY. 360 capsule 3  . diclofenac (VOLTAREN) 75 MG EC tablet Take 1 tablet (75 mg total) by mouth 2 (two) times daily. 50 tablet 2  . oxyCODONE-acetaminophen (PERCOCET/ROXICET) 5-325 MG tablet Take 1 tablet by mouth every 4 (four) hours as needed. 20 tablet 0  . predniSONE (DELTASONE) 10 MG tablet 6, 5, 4, 3, 2 then 1 tablet by mouth daily for 6 days total. 21 tablet 0   No facility-administered medications prior to visit.     PAST MEDICAL HISTORY: Past Medical History:  Diagnosis Date  . Cancer [facility]) february 1990   melanoma stage 3 left shoulder blade  . Closed left arm fracture   . Headache(784.0)   . History of headache   . History of renal calculi   . Hypertension   . Lumbar herniated disc 11/2015   L4-5, receiving epidural steroid  injections  . Restless leg syndrome   . Seizures ([place])     PAST SURGICAL HISTORY: Past Surgical History:  Procedure Laterality Date  . FRACTURE SURGERY    . SHOULDER SURGERY     Melanoma rexected    FAMILY HISTORY: Family History  Problem Relation Age of Onset  . Depression Mother   . Breast cancer Mother   . Throat cancer Father     SOCIAL HISTORY: Social History   Social History  . Marital status: Married    Spouse name:  N/A  . Number of children: 0  . Years of education: 12   Occupational History  . Service Representative    Social History Main Topics  . Smoking status: Never Smoker  . Smokeless tobacco: Never Used  . Alcohol use Yes     Comment: 2 beers year  . Drug use: No  . Sexual activity: Yes   Other Topics Concern  . Not on file   Social History Narrative  . No narrative on file     PHYSICAL EXAM  Vitals:   12/08/16 0917  BP: 100/72  Pulse: (!) 56  Weight: 167 lb 12.8 oz (76.1 kg)   Body mass index is 26.28 kg/m. Generalized: Well developed, in no acute distress  Neurological examination   Mentation: Alert oriented to time, place, history taking. Follows all commands speech and language fluent  Cranial nerve II-XII: Pupils were equal round reactive to light extraocular movements were full, visual field were full on confrontational test. Facial sensation and strength were normal. hearing was intact to finger rubbing bilaterally. Uvula tongue midline. head turning and shoulder shrug were normal and symmetric.Tongue protrusion into cheek strength was normal. Motor: normal bulk and tone, full strength in the BUE, BLE, fine finger movements normal, no pronator drift. No focal weakness Coordination: finger-nose-finger, heel-to-shin bilaterally, no dysmetria Reflexes: Symmetric upper and lower plantar responses were flexor bilaterally. Gait and Station: Rising up from seated position without assistance, normal stance, moderate stride, good arm swing, smooth turning, able to perform tiptoe, and heel walking without difficulty. Tandem gait is steady DIAGNOSTIC DATA (LABS, IMAGING, TESTING) -  ASSESSMENT AND PLAN 51y.o. year old male has a past medical history of Cancer (february 1990); Seizures; Hypertension; History of headache; here to follow-up for his seizure disorder. He is currently on Dilantin with no seizure events since 2014.   Continue Dilantin at current dose will refill  once labs back Obtain CBC, CMP, to monitor adverse effects of Dilantin Dilantin level to monitor for therapeutic level / toxicity,  Call for any seizure activity Follow-up yearly and when necessary [name], [facility], [facility], [facility] [address] [place], [place] [address] [phone]

## 2016-12-08 NOTE — Patient Instructions (Signed)
Continue Dilantin at current dose will refill Obtain CBC, CMP, and Dilantin level to monitor for adverse effects, toxicity,  Call for any seizure activity Follow-up yearly and when necessary

## 2016-12-09 ENCOUNTER — Other Ambulatory Visit: Payer: Self-pay | Admitting: Nurse Practitioner

## 2016-12-09 ENCOUNTER — Telehealth: Payer: Self-pay | Admitting: *Deleted

## 2016-12-09 LAB — COMPREHENSIVE METABOLIC PANEL
ALBUMIN: 4.4 g/dL (ref 3.5–5.5)
ALK PHOS: 99 IU/L (ref 39–117)
ALT: 18 IU/L (ref 0–44)
AST: 21 IU/L (ref 0–40)
Albumin/Globulin Ratio: 1.8 (ref 1.2–2.2)
BUN/Creatinine Ratio: 15 (ref 9–20)
BUN: 14 mg/dL (ref 6–24)
Bilirubin Total: <0.2 mg/dL (ref 0.0–1.2)
CALCIUM: 9.3 mg/dL (ref 8.7–10.2)
CO2: 25 mmol/L (ref 20–29)
CREATININE: 0.91 mg/dL (ref 0.76–1.27)
Chloride: 102 mmol/L (ref 96–106)
GFR calc Af Amer: 112 mL/min/{1.73_m2} (ref >59)
GFR, EST NON AFRICAN AMERICAN: 97 mL/min/{1.73_m2} (ref >59)
GLOBULIN, TOTAL: 2.5 g/dL (ref 1.5–4.5)
GLUCOSE: 99 mg/dL (ref 65–99)
Potassium: 4.7 mmol/L (ref 3.5–5.2)
SODIUM: 141 mmol/L (ref 134–144)
Total Protein: 6.9 g/dL (ref 6.0–8.5)

## 2016-12-09 LAB — CBC WITH DIFFERENTIAL/PLATELET
Basophils Absolute: 0 x10E3/uL (ref 0.0–0.2)
Basos: 1 %
EOS (ABSOLUTE): 0.5 x10E3/uL — ABNORMAL HIGH (ref 0.0–0.4)
Eos: 7 %
Hematocrit: 37.9 % (ref 37.5–51.0)
Hemoglobin: 12.4 g/dL — ABNORMAL LOW (ref 13.0–17.7)
Immature Grans (Abs): 0 x10E3/uL (ref 0.0–0.1)
Immature Granulocytes: 0 %
Lymphocytes Absolute: 1.8 x10E3/uL (ref 0.7–3.1)
Lymphs: 27 %
MCH: 29.2 pg (ref 26.6–33.0)
MCHC: 32.7 g/dL (ref 31.5–35.7)
MCV: 89 fL (ref 79–97)
Monocytes Absolute: 0.7 x10E3/uL (ref 0.1–0.9)
Monocytes: 10 %
Neutrophils Absolute: 3.6 x10E3/uL (ref 1.4–7.0)
Neutrophils: 55 %
Platelets: 284 x10E3/uL (ref 150–379)
RBC: 4.24 x10E6/uL (ref 4.14–5.80)
RDW: 13.6 % (ref 12.3–15.4)
WBC: 6.6 x10E3/uL (ref 3.4–10.8)

## 2016-12-09 LAB — PHENYTOIN LEVEL, TOTAL: Phenytoin (Dilantin), Serum: 9.6 ug/mL — ABNORMAL LOW (ref 10.0–20.0)

## 2016-12-09 MED ORDER — PHENYTOIN SODIUM EXTENDED 100 MG PO CAPS: 400.0000 mg | ORAL_CAPSULE | Freq: Every day | ORAL | 3 refills | Status: DC

## 2016-12-09 NOTE — Telephone Encounter (Signed)
Spoke with patient and informed him that his labs look good. He verbalized understanding, appreciation.

## 2017-05-12 DIAGNOSIS — M79641 Pain in right hand: Secondary | ICD-10-CM | POA: Diagnosis not present

## 2017-06-06 DIAGNOSIS — E782 Mixed hyperlipidemia: Secondary | ICD-10-CM | POA: Diagnosis not present

## 2017-06-09 DIAGNOSIS — Z0000 Encounter for general adult medical examination without abnormal findings: Secondary | ICD-10-CM | POA: Diagnosis not present

## 2017-06-09 DIAGNOSIS — E782 Mixed hyperlipidemia: Secondary | ICD-10-CM | POA: Diagnosis not present

## 2017-12-07 NOTE — Progress Notes (Signed)
[facility]  PATIENT: Seth Liu DOB: [birth year]   REASON FOR VISIT: Follow-up for generalized convulsive epilepsy HISTORY FROM:patient    HISTORY OF PRESENT ILLNESS:Seth Liu, 52 -year-old white male returns for yearly followup.   He has a history of seizure disorder with last seizure occurring on 12/09/2012. He had 2 generalized seizures at that time went home and another witnessed in the hospital, he had a Dilantin level of less than 2.5. He had missed a Dilantin dose. He is currently taking 200 mg twice daily without further seizure events. He is  driving without difficulty. He denies side effects to the medication, no balance issues no falls no problems with his gums. He returns for reevaluation. He needs labs to monitor the side effects of Dilantin as well as refills.  He is currently having some marital difficulties.   HISTORY:of a seizure disorder. The patient has been well controlled on Dilantin, and typically he runs Dilantin levels anywhere from 4.5 to around 9, and does well with this. Around 12/09/2012, the patient had at least 2 generalized seizures. One occurred at home, and another was witnessed in the hospital. The patient had a Dilantin level of less than 2.5. The patient had missed the Dilantin dose the day prior, and he had just taken his medications within moments prior to the seizure. The patient indicates that he has been working 2 jobs, and he has been getting minimal sleep. The patient works as a Forensic scientist, and his job is to operate a Teacher, music throughout the day. The patient returns for an evaluation. The patient did not suffer an injury with the seizure.    REVIEW OF SYSTEMS: Full 14 system review of systems performed and notable only for those listed, all others are neg:  Constitutional: neg  Cardiovascular: neg Ear/Nose/Throat: neg  Skin: neg Eyes: neg Respiratory: neg Gastroitestinal: neg  Hematology/Lymphatic: neg  Endocrine:  neg Musculoskeletal:neg Allergy/Immunology: Seasonal allergies Neurological: History of seizure disorder Psychiatric: neg Sleep : neg   ALLERGIES: No Known Allergies  HOME MEDICATIONS: Outpatient Medications Prior to Visit  Medication Sig Dispense Refill  . bisoprolol-hydrochlorothiazide (ZIAC) 10-6.25 MG per tablet Take 1 tablet by mouth daily.    . Calcium Carbonate-Vitamin D (CALCIUM + D PO) Take 1 tablet by mouth daily.    Marland Kitchen dexlansoprazole (DEXILANT) 60 MG capsule Take 60 mg by mouth daily as needed (Acid reflux).     Marland Kitchen lisinopril (PRINIVIL,ZESTRIL) 20 MG tablet Take 20 mg by mouth daily.    . Multiple Vitamins-Minerals (MULTIVITAMIN PO) Take 1 tablet by mouth daily.    . phenytoin (DILANTIN) 100 MG ER capsule Take 4 capsules (400 mg total) by mouth daily. 360 capsule 3   No facility-administered medications prior to visit.     PAST MEDICAL HISTORY: Past Medical History:  Diagnosis Date  . Cancer [facility]) february 1990   melanoma stage 3 left shoulder blade  . Closed left arm fracture   . Headache(784.0)   . History of headache   . History of renal calculi   . Hypertension   . Lumbar herniated disc 11/2015   L4-5, receiving epidural steroid injections  . Restless leg syndrome   . Seizures ([place])     PAST SURGICAL HISTORY: Past Surgical History:  Procedure Laterality Date  . FRACTURE SURGERY    . SHOULDER SURGERY     Melanoma rexected    FAMILY HISTORY: Family History  Problem Relation Age of Onset  . Depression Mother   . Breast  cancer Mother   . Throat cancer Father     SOCIAL HISTORY: Social History   Socioeconomic History  . Marital status: Married    Spouse name: Not on file  . Number of children: 0  . Years of education: 79  . Highest education level: Not on file  Occupational History  . Occupation: Therapist, occupational  Social Needs  . Financial resource strain: Not on file  . Food insecurity:    Worry: Not on file    Inability: Not on  file  . Transportation needs:    Medical: Not on file    Non-medical: Not on file  Tobacco Use  . Smoking status: Never Smoker  . Smokeless tobacco: Never Used  Substance and Sexual Activity  . Alcohol use: Yes    Comment: 2 beers year  . Drug use: No  . Sexual activity: Yes  Lifestyle  . Physical activity:    Days per week: Not on file    Minutes per session: Not on file  . Stress: Not on file  Relationships  . Social connections:    Talks on phone: Not on file    Gets together: Not on file    Attends religious service: Not on file    Active member of club or organization: Not on file    Attends meetings of clubs or organizations: Not on file    Relationship status: Not on file  . Intimate partner violence:    Fear of current or ex partner: Not on file    Emotionally abused: Not on file    Physically abused: Not on file    Forced sexual activity: Not on file  Other Topics Concern  . Not on file  Social History Narrative   Does shift work     PHYSICAL EXAM  Vitals:   12/09/17 1022  BP: 108/68  Pulse: 65  Weight: 164 lb 3.2 oz (74.5 kg)  Height: 5\' 6"  (1.676 m)   Body mass index is 26.5 kg/m. Generalized: Well developed, in no acute distress  Neurological examination   Mentation: Alert oriented to time, place, history taking. Follows all commands speech and language fluent  Cranial nerve II-XII: Pupils were equal round reactive to light extraocular movements were full, visual field were full on confrontational test. Facial sensation and strength were normal. hearing was intact to finger rubbing bilaterally. Uvula tongue midline. head turning and shoulder shrug were normal and symmetric.Tongue protrusion into cheek strength was normal. Motor: normal bulk and tone, full strength in the BUE, BLE, fine finger movements normal, no pronator drift. No focal weakness Coordination: finger-nose-finger, heel-to-shin bilaterally, no dysmetria Reflexes: Symmetric upper and  lower plantar responses were flexor bilaterally. Gait and Station: Rising up from seated position without assistance, normal stance, moderate stride, good arm swing, smooth turning, able to perform tiptoe, and heel walking without difficulty. Tandem gait is steady DIAGNOSTIC DATA (LABS, IMAGING, TESTING) -  ASSESSMENT AND PLAN 52y.o. year old male has a past medical history of Cancer (february 1990); Seizures; Hypertension; History of headache; here to follow-up for his seizure disorder. He is currently on Dilantin with no seizure events since 2014.The patient is a current patient of Dr. Jannifer Franklin who is out of the office today . This note is sent to the work in doctor.      Continue Dilantin at current dose will refill once labs back Obtain CBC, CMP, to monitor adverse effects of Dilantin Dilantin level to monitor for therapeutic level / toxicity,  Call for any seizure activity Follow-up yearly and when necessary [name], [facility], [facility], APRN  [facility] [address] [place], [place] [address] [phone]

## 2017-12-09 ENCOUNTER — Encounter: Payer: Self-pay | Admitting: Nurse Practitioner

## 2017-12-09 ENCOUNTER — Ambulatory Visit (INDEPENDENT_AMBULATORY_CARE_PROVIDER_SITE_OTHER): Payer: 59 | Admitting: Nurse Practitioner

## 2017-12-09 VITALS — BP 108/68 | HR 65 | Ht 66.0 in | Wt 164.2 lb

## 2017-12-09 DIAGNOSIS — G40309 Generalized idiopathic epilepsy and epileptic syndromes, not intractable, without status epilepticus: Secondary | ICD-10-CM | POA: Diagnosis not present

## 2017-12-09 DIAGNOSIS — Z5181 Encounter for therapeutic drug level monitoring: Secondary | ICD-10-CM

## 2017-12-09 NOTE — Patient Instructions (Signed)
Continue Dilantin at current dose will refill once labs back Obtain CBC, CMP, to monitor adverse effects of Dilantin Dilantin level to monitor for therapeutic level / toxicity,  Call for any seizure activity Follow-up yearly and when necessary

## 2017-12-10 LAB — CBC WITH DIFFERENTIAL/PLATELET
BASOS: 1 %
Basophils Absolute: 0.1 10*3/uL (ref 0.0–0.2)
EOS (ABSOLUTE): 0.3 10*3/uL (ref 0.0–0.4)
EOS: 4 %
HEMATOCRIT: 40.3 % (ref 37.5–51.0)
Hemoglobin: 13.4 g/dL (ref 13.0–17.7)
Immature Grans (Abs): 0 10*3/uL (ref 0.0–0.1)
Immature Granulocytes: 0 %
LYMPHS ABS: 1.3 10*3/uL (ref 0.7–3.1)
Lymphs: 18 %
MCH: 29.5 pg (ref 26.6–33.0)
MCHC: 33.3 g/dL (ref 31.5–35.7)
MCV: 89 fL (ref 79–97)
Monocytes Absolute: 0.5 10*3/uL (ref 0.1–0.9)
Monocytes: 8 %
Neutrophils Absolute: 4.8 10*3/uL (ref 1.4–7.0)
Neutrophils: 69 %
Platelets: 295 10*3/uL (ref 150–450)
RBC: 4.55 x10E6/uL (ref 4.14–5.80)
RDW: 12.6 % (ref 12.3–15.4)
WBC: 7 10*3/uL (ref 3.4–10.8)

## 2017-12-10 LAB — COMPREHENSIVE METABOLIC PANEL
A/G RATIO: 1.9 (ref 1.2–2.2)
ALK PHOS: 97 IU/L (ref 39–117)
ALT: 15 IU/L (ref 0–44)
AST: 16 IU/L (ref 0–40)
Albumin: 4.4 g/dL (ref 3.5–5.5)
BUN / CREAT RATIO: 23 — AB (ref 9–20)
BUN: 27 mg/dL — AB (ref 6–24)
Bilirubin Total: 0.2 mg/dL (ref 0.0–1.2)
CALCIUM: 9.2 mg/dL (ref 8.7–10.2)
CO2: 23 mmol/L (ref 20–29)
CREATININE: 1.19 mg/dL (ref 0.76–1.27)
Chloride: 100 mmol/L (ref 96–106)
GFR, EST AFRICAN AMERICAN: 81 mL/min/{1.73_m2} (ref >59)
GFR, EST NON AFRICAN AMERICAN: 70 mL/min/{1.73_m2} (ref >59)
GLOBULIN, TOTAL: 2.3 g/dL (ref 1.5–4.5)
Glucose: 102 mg/dL — ABNORMAL HIGH (ref 65–99)
Potassium: 3.8 mmol/L (ref 3.5–5.2)
SODIUM: 136 mmol/L (ref 134–144)
TOTAL PROTEIN: 6.7 g/dL (ref 6.0–8.5)

## 2017-12-10 LAB — PHENYTOIN LEVEL, TOTAL: Phenytoin (Dilantin), Serum: 7 ug/mL — ABNORMAL LOW (ref 10.0–20.0)

## 2017-12-12 ENCOUNTER — Telehealth: Payer: Self-pay | Admitting: Nurse Practitioner

## 2017-12-12 DIAGNOSIS — G40909 Epilepsy, unspecified, not intractable, without status epilepticus: Secondary | ICD-10-CM

## 2017-12-12 NOTE — Telephone Encounter (Signed)
Labs stable except Dilantin level 7 which is low.  Please increase to 5 capsules daily.  Please call the patient.  I just refilled your prescription when you were here please call when that prescription runs out and we will fill with the new number of capsules

## 2017-12-12 NOTE — Telephone Encounter (Signed)
LVM requesting call back re: lab results and medication.

## 2017-12-15 NOTE — Telephone Encounter (Signed)
Called, LVM for pt to call. Relayed results per CM,NP note and advised we would like to send in new rx Dilantin for 5 caps daily. He is currently taking 4 caps daily per MAR. Want to verify which pharmacy he would like new rx to go to.

## 2017-12-15 NOTE — Telephone Encounter (Signed)
I called pt back. Advised Dilantin level 7 and reference range 10-20. He received my message but does not agree that he should increase his dose. He states the day he came for appt his last Dilantin dose was 2pm previous day. He did not have labs done until 10-11am next day. He would like Kelli Hope, NP to consider this and determine if he still needs to change his dose. He states he feels fine, stable. He is considering taking tonight off work and then he will be off until Tuesday during the day.  Advised he may have to come for another lab level. I will send message to CM,NP to see how she wants him to proceed. We will call back.

## 2017-12-15 NOTE — Telephone Encounter (Signed)
Pt has returned the call to RN Verneita Griffes and RN Terrence Dupont, pt would very much like a call back as soon as possible so that he can go to sleep due to working at night.

## 2017-12-15 NOTE — Telephone Encounter (Signed)
Please have patient come in for a trough level of Dilantin next week order in the system

## 2017-12-15 NOTE — Telephone Encounter (Signed)
Seth Liu I called pt back. Scheduled him for lab visit on 12/19/17 at 130pm. He takes Dilantin between 2-4pm since he works nights and days. He will make sure not to take medication prior to lab draw.

## 2017-12-15 NOTE — Addendum Note (Signed)
Addended by: Otilio Jefferson on: 12/15/2017 02:05 PM   Modules accepted: Orders

## 2017-12-19 ENCOUNTER — Other Ambulatory Visit: Payer: Self-pay | Admitting: Neurology

## 2017-12-19 ENCOUNTER — Other Ambulatory Visit: Payer: Self-pay | Admitting: Nurse Practitioner

## 2017-12-19 ENCOUNTER — Other Ambulatory Visit: Payer: Self-pay

## 2017-12-19 NOTE — Telephone Encounter (Signed)
Mrs Seth Liu not on DPR)[phone] has called to inform that pt is in police custody and according to sister she has a few of pt's pills but no bottle.  She states pt wife has bottle and has refused to give it.  Pt sister-Mrs Seth Liu states pt is expected to appear in front of judge this morning between 10-2 today.  Mrs Seth Liu is asking if there is something that can be done re: pt's medication.  Phone rep.explained that because sister is not on DPR  Procedure and policy wise she is likley not going to receive a call back but that the message would be sent on her behalf to RN

## 2017-12-19 NOTE — Telephone Encounter (Signed)
Noted if in police customy can get meds from [facility]

## 2018-01-25 NOTE — Progress Notes (Signed)
I agree with the assessment and plan as directed by NP .The patient is known to me .   Tamya Denardo, MD  

## 2018-06-12 DIAGNOSIS — M545 Low back pain: Secondary | ICD-10-CM | POA: Diagnosis not present

## 2018-06-12 DIAGNOSIS — R109 Unspecified abdominal pain: Secondary | ICD-10-CM | POA: Diagnosis not present

## 2018-07-19 DIAGNOSIS — Z0000 Encounter for general adult medical examination without abnormal findings: Secondary | ICD-10-CM | POA: Diagnosis not present

## 2018-07-19 DIAGNOSIS — Z87898 Personal history of other specified conditions: Secondary | ICD-10-CM | POA: Diagnosis not present

## 2018-07-19 DIAGNOSIS — I10 Essential (primary) hypertension: Secondary | ICD-10-CM | POA: Diagnosis not present

## 2018-07-19 DIAGNOSIS — Z23 Encounter for immunization: Secondary | ICD-10-CM | POA: Diagnosis not present

## 2018-07-28 DIAGNOSIS — D649 Anemia, unspecified: Secondary | ICD-10-CM | POA: Diagnosis not present

## 2018-07-28 DIAGNOSIS — I10 Essential (primary) hypertension: Secondary | ICD-10-CM | POA: Diagnosis not present

## 2018-12-11 NOTE — Progress Notes (Signed)
PATIENT: Seth Liu DOB: [birth year]  REASON FOR VISIT: follow up HISTORY FROM: patient  HISTORY OF PRESENT ILLNESS: Today 12/12/18  Seth Liu.  Is a 53 year old male with history of seizure disorder.  His last seizure occurred in 2014.  At last visit his Dilantin level was low at 7.0, his dose was increased to 5 capsules daily (500 mg total).  He was supposed to come back for a trough level, but did not. His last seizure occurred in 2014. He says when he had seizure in 2014, he had missed a dose of his dilantin and he was sleep depreived. He has not had recurrent seizure. 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 He takes his Dilantin in the afternoon, he takes 400 mg daily. He is tolerating the medication well. He denies any new problems or concerns. He does see his primary care regularly. He says he is getting a divorce. He remains on calcium and vitamin D supplementation. He presents for follow-up unaccompanied. He says he is very consistent taking his medication and getting adequate rest daily.   HISTORY  12/09/2017 CM: Seth Liu, 86 -year-old white male returns for yearly followup.   He has a history of seizure disorder with last seizure occurring on 12/09/2012. He had 2 generalized seizures at that time went home and another witnessed in the hospital, he had a Dilantin level of less than 2.5. He had missed a Dilantin dose. He is currently taking 200 mg twice daily without further seizure events. He is  driving without difficulty. He denies side effects to the medication, no balance issues no falls no problems with his gums. He returns for reevaluation. He needs labs to monitor the side effects of Dilantin as well as refills.  He is currently having some marital difficulties.  REVIEW OF SYSTEMS: Out of a complete 14 system review of symptoms, the patient complains only of the following symptoms, and all other reviewed systems are negative.  Seizures   ALLERGIES: No Known Allergies   HOME MEDICATIONS: Outpatient Medications Prior to Visit  Medication Sig Dispense Refill  . bisoprolol-hydrochlorothiazide (ZIAC) 10-6.25 MG per tablet Take 1 tablet by mouth daily.    . Calcium Carbonate-Vitamin D (CALCIUM + D PO) Take 1 tablet by mouth daily.    Marland Kitchen dexlansoprazole (DEXILANT) 60 MG capsule Take 60 mg by mouth daily as needed (Acid reflux).     Marland Kitchen lisinopril (PRINIVIL,ZESTRIL) 20 MG tablet Take 20 mg by mouth daily.    . Multiple Vitamins-Minerals (MULTIVITAMIN PO) Take 1 tablet by mouth daily.    . phenytoin (DILANTIN) 100 MG ER capsule TAKE 4 CAPSULES BY MOUTH ONCE DAILY 360 capsule 3   No facility-administered medications prior to visit.     PAST MEDICAL HISTORY: Past Medical History:  Diagnosis Date  . Cancer [facility]) february 1990   melanoma stage 3 left shoulder blade  . Closed left arm fracture   . Headache(784.0)   . History of headache   . History of renal calculi   . Hypertension   . Lumbar herniated disc 11/2015   L4-5, receiving epidural steroid injections  . Restless leg syndrome   . Seizures ([place])     PAST SURGICAL HISTORY: Past Surgical History:  Procedure Laterality Date  . FRACTURE SURGERY    . SHOULDER SURGERY     Melanoma rexected    FAMILY HISTORY: Family History  Problem Relation Age of Onset  . Depression Mother   . Breast cancer Mother   .  Throat cancer Father     SOCIAL HISTORY: Social History   Socioeconomic History  . Marital status: Married    Spouse name: Not on file  . Number of children: 0  . Years of education: 57  . Highest education level: Not on file  Occupational History  . Occupation: Therapist, occupational  Social Needs  . Financial resource strain: Not on file  . Food insecurity    Worry: Not on file    Inability: Not on file  . Transportation needs    Medical: Not on file    Non-medical: Not on file  Tobacco Use  . Smoking status: Never Smoker  . Smokeless tobacco: Never Used  Substance and Sexual  Activity  . Alcohol use: Yes    Comment: 2 beers year  . Drug use: No  . Sexual activity: Yes  Lifestyle  . Physical activity    Days per week: Not on file    Minutes per session: Not on file  . Stress: Not on file  Relationships  . Social Herbalist on phone: Not on file    Gets together: Not on file    Attends religious service: Not on file    Active member of club or organization: Not on file    Attends meetings of clubs or organizations: Not on file    Relationship status: Not on file  . Intimate partner violence    Fear of current or ex partner: Not on file    Emotionally abused: Not on file    Physically abused: Not on file    Forced sexual activity: Not on file  Other Topics Concern  . Not on file  Social History Narrative   Does shift work   PHYSICAL EXAM  Vitals:   12/12/18 0827  BP: 111/69  Pulse: (!) 52  Temp: (!) 97.3 F (36.3 C)  Weight: 175 lb (79.4 kg)  Height: 5\' 6"  (1.676 m)   Body mass index is 28.25 kg/m.  Generalized: Well developed, in no acute distress   Neurological examination  Mentation: Alert oriented to time, place, history taking. Follows all commands speech and language fluent Cranial nerve II-XII: Pupils were equal round reactive to light. Extraocular movements were full, visual field were full on confrontational test. Facial sensation and strength were normal. Uvula tongue midline. Head turning and shoulder shrug  were normal and symmetric. Motor: The motor testing reveals 5 over 5 strength of all 4 extremities. Good symmetric motor tone is noted throughout.  Sensory: Sensory testing is intact to soft touch on all 4 extremities. No evidence of extinction is noted.  Coordination: Cerebellar testing reveals good finger-nose-finger and heel-to-shin bilaterally.  Gait and station: Gait is normal. Tandem gait is normal. Romberg is negative. No drift is seen.  Reflexes: Deep tendon reflexes are symmetric and normal bilaterally.    DIAGNOSTIC DATA (LABS, IMAGING, TESTING) - I reviewed patient records, labs, notes, testing and imaging myself where available.  Lab Results  Component Value Date   WBC 7.0 12/09/2017   HGB 13.4 12/09/2017   HCT 40.3 12/09/2017   MCV 89 12/09/2017   PLT 295 12/09/2017      Component Value Date/Time   NA 136 12/09/2017 1051   K 3.8 12/09/2017 1051   CL 100 12/09/2017 1051   CO2 23 12/09/2017 1051   GLUCOSE 102 (H) 12/09/2017 1051   GLUCOSE 106 (H) 12/10/2012 0215   BUN 27 (H) 12/09/2017 1051   CREATININE 1.19 12/09/2017  1051   CALCIUM 9.2 12/09/2017 1051   PROT 6.7 12/09/2017 1051   ALBUMIN 4.4 12/09/2017 1051   AST 16 12/09/2017 1051   ALT 15 12/09/2017 1051   ALKPHOS 97 12/09/2017 1051   BILITOT 0.2 12/09/2017 1051   GFRNONAA 70 12/09/2017 1051   GFRAA 81 12/09/2017 1051   No results found for: CHOL, HDL, LDLCALC, LDLDIRECT, TRIG, CHOLHDL No results found for: HGBA1C No results found for: VITAMINB12 No results found for: TSH  ASSESSMENT AND PLAN 53 y.o. year old male  has a past medical history of Cancer ([place]) (february 1990), Closed left arm fracture, Headache(784.0), History of headache, History of renal calculi, Hypertension, Lumbar herniated disc (11/2015), Restless leg syndrome, and Seizures ([place]). here with:  1.  Seizures  He has not had recurrent seizure since 2014.  He reports compliance with Dilantin, taking 400 mg daily, in the afternoon.  He denies side effects of the medication.  I will check blood work today, including a Dilantin level.  In the past his Dilantin level has been low, but has not been adjusted since he has been stable.  He will follow-up in 1 year or sooner if needed.  I advised that if his symptoms worsen or if he develops any new symptoms he should let us know.   I spent 15 minutes with the patient. 50% of this time was spent discussing his plan of care.   Butler Denmark, AGNP-C, DNP 12/12/2018, 8:57 AM [facility] [address] [place], [place] [address] [phone]

## 2018-12-12 ENCOUNTER — Encounter: Payer: Self-pay | Admitting: Neurology

## 2018-12-12 ENCOUNTER — Ambulatory Visit: Payer: 59 | Admitting: Neurology

## 2018-12-12 ENCOUNTER — Other Ambulatory Visit: Payer: Self-pay

## 2018-12-12 VITALS — BP 111/69 | HR 52 | Temp 97.3°F | Ht 66.0 in | Wt 175.0 lb

## 2018-12-12 DIAGNOSIS — R569 Unspecified convulsions: Secondary | ICD-10-CM | POA: Diagnosis not present

## 2018-12-12 MED ORDER — PHENYTOIN SODIUM EXTENDED 100 MG PO CAPS: ORAL_CAPSULE | ORAL | 3 refills | Status: DC

## 2018-12-12 NOTE — Progress Notes (Signed)
I have read the note, and I agree with the clinical assessment and plan.  Charles K Willis   

## 2018-12-12 NOTE — Patient Instructions (Signed)
It was wonderful meeting you today! I see you in 1 year!

## 2018-12-13 ENCOUNTER — Telehealth: Payer: Self-pay | Admitting: *Deleted

## 2018-12-13 LAB — CBC WITH DIFFERENTIAL/PLATELET
Basophils Absolute: 0.1 10*3/uL (ref 0.0–0.2)
Basos: 1 %
EOS (ABSOLUTE): 0.4 10*3/uL (ref 0.0–0.4)
Eos: 7 %
Hematocrit: 39.7 % (ref 37.5–51.0)
Hemoglobin: 13.4 g/dL (ref 13.0–17.7)
Immature Grans (Abs): 0 10*3/uL (ref 0.0–0.1)
Immature Granulocytes: 0 %
Lymphocytes Absolute: 1.8 10*3/uL (ref 0.7–3.1)
Lymphs: 27 %
MCH: 29.6 pg (ref 26.6–33.0)
MCHC: 33.8 g/dL (ref 31.5–35.7)
MCV: 88 fL (ref 79–97)
Monocytes Absolute: 0.7 10*3/uL (ref 0.1–0.9)
Monocytes: 10 %
Neutrophils Absolute: 3.7 10*3/uL (ref 1.4–7.0)
Neutrophils: 55 %
Platelets: 279 10*3/uL (ref 150–450)
RBC: 4.53 x10E6/uL (ref 4.14–5.80)
RDW: 12.8 % (ref 11.6–15.4)
WBC: 6.6 10*3/uL (ref 3.4–10.8)

## 2018-12-13 LAB — COMPREHENSIVE METABOLIC PANEL
ALT: 18 IU/L (ref 0–44)
AST: 22 IU/L (ref 0–40)
Albumin/Globulin Ratio: 1.9 (ref 1.2–2.2)
Albumin: 4 g/dL (ref 3.8–4.9)
Alkaline Phosphatase: 90 IU/L (ref 39–117)
BUN/Creatinine Ratio: 16 (ref 9–20)
BUN: 20 mg/dL (ref 6–24)
Bilirubin Total: <0.2 mg/dL (ref 0.0–1.2)
CO2: 23 mmol/L (ref 20–29)
Calcium: 9.2 mg/dL (ref 8.7–10.2)
Chloride: 100 mmol/L (ref 96–106)
Creatinine, Ser: 1.24 mg/dL (ref 0.76–1.27)
GFR calc Af Amer: 76 mL/min/{1.73_m2} (ref >59)
GFR calc non Af Amer: 66 mL/min/{1.73_m2} (ref >59)
Globulin, Total: 2.1 g/dL (ref 1.5–4.5)
Glucose: 98 mg/dL (ref 65–99)
Potassium: 4.8 mmol/L (ref 3.5–5.2)
Sodium: 138 mmol/L (ref 134–144)
Total Protein: 6.1 g/dL (ref 6.0–8.5)

## 2018-12-13 LAB — PHENYTOIN LEVEL, TOTAL: Phenytoin (Dilantin), Serum: 8.2 ug/mL — ABNORMAL LOW (ref 10.0–20.0)

## 2018-12-13 NOTE — Telephone Encounter (Signed)
LVM informing the patient that his Dilantin level is low, normal 10-20. It has been low in the past, but he does not want to adjust his medication. Historically, his level has been low, but he has done well. He has not had a seizure since 2014. Advised he continue with same dose, call for any questions. Left number.

## 2018-12-15 ENCOUNTER — Ambulatory Visit: Payer: 59 | Admitting: Nurse Practitioner

## 2019-12-13 ENCOUNTER — Ambulatory Visit (INDEPENDENT_AMBULATORY_CARE_PROVIDER_SITE_OTHER): Payer: 59 | Admitting: Neurology

## 2019-12-13 ENCOUNTER — Encounter: Payer: Self-pay | Admitting: Neurology

## 2019-12-13 VITALS — BP 122/82 | HR 58 | Ht 66.0 in | Wt 176.0 lb

## 2019-12-13 DIAGNOSIS — G40909 Epilepsy, unspecified, not intractable, without status epilepticus: Secondary | ICD-10-CM | POA: Diagnosis not present

## 2019-12-13 DIAGNOSIS — G40309 Generalized idiopathic epilepsy and epileptic syndromes, not intractable, without status epilepticus: Secondary | ICD-10-CM

## 2019-12-13 MED ORDER — PHENYTOIN SODIUM EXTENDED 100 MG PO CAPS: ORAL_CAPSULE | ORAL | 3 refills | Status: DC

## 2019-12-13 NOTE — Patient Instructions (Signed)
Continue current medications Check blood work today See you back in 1 year  

## 2019-12-13 NOTE — Progress Notes (Signed)
PATIENT: Seth Liu DOB: May 26, 1965  REASON FOR VISIT: follow up HISTORY FROM: patient  HISTORY OF PRESENT ILLNESS: Today 12/13/19  Seth Liu is a 54 year old male with history of seizure disorder.  Last seizure was in 2014 when he missed a dose of his Dilantin and he was sleep deprived.  Is currently taking Dilantin 400 mg daily, previously level has been low at 7.0, his dose was increased to 500 mg daily, he has stayed on the 400 mg daily dose.  Last year Dilantin level was 8.2.  Wants to stay at current dose, with higher doses reports is forgetful, has dry mouth, create more problem for him.  He continues to work full-time at [employer] and [employer].  He is going through divorce.  Denies any new problems or concerns.  Seems to be tolerating Dilantin well.  Presents today for evaluation unaccompanied.  HISTORY 12/12/2018 SS: Seth Liu.  Is a 54 year old male with history of seizure disorder.  His last seizure occurred in 2014.  At last visit his Dilantin level was low at 7.0, his dose was increased to 5 capsules daily (500 mg total).  He was supposed to come back for a trough level, but did not. His last seizure occurred in 2014. He says when he had seizure in 2014, he had missed a dose of his dilantin and he was sleep depreived. He has not had recurrent seizure. 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 He takes his Dilantin in the afternoon, he takes 400 mg daily. He is tolerating the medication well. He denies any new problems or concerns. He does see his primary care regularly. He says he is getting a divorce. He remains on calcium and vitamin D supplementation. He presents for follow-up unaccompanied. He says he is very consistent taking his medication and getting adequate rest daily.    REVIEW OF SYSTEMS: Out of a complete 14 system review of symptoms, the patient complains only of the following symptoms, and all other reviewed systems are negative.  N/A  ALLERGIES: No Known  Allergies  HOME MEDICATIONS: Outpatient Medications Prior to Visit  Medication Sig Dispense Refill  . bisoprolol-hydrochlorothiazide (ZIAC) 10-6.25 MG per tablet Take 1 tablet by mouth daily.    . Calcium Carbonate-Vitamin D (CALCIUM + D PO) Take 1 tablet by mouth daily.    Marland Kitchen dexlansoprazole (DEXILANT) 60 MG capsule Take 60 mg by mouth daily as needed (Acid reflux).     Marland Kitchen lisinopril (PRINIVIL,ZESTRIL) 20 MG tablet Take 20 mg by mouth daily.    . Multiple Vitamins-Minerals (MULTIVITAMIN PO) Take 1 tablet by mouth daily.    . phenytoin (DILANTIN) 100 MG ER capsule TAKE 4 CAPSULES BY MOUTH ONCE DAILY 360 capsule 3   No facility-administered medications prior to visit.    PAST MEDICAL HISTORY: Past Medical History:  Diagnosis Date  . Cancer [facility]) february 1990   melanoma stage 3 left shoulder blade  . Closed left arm fracture   . Headache(784.0)   . History of headache   . History of renal calculi   . Hypertension   . Lumbar herniated disc 11/2015   L4-5, receiving epidural steroid injections  . Restless leg syndrome   . Seizures ([place])     PAST SURGICAL HISTORY: Past Surgical History:  Procedure Laterality Date  . FRACTURE SURGERY    . SHOULDER SURGERY     Melanoma rexected    FAMILY HISTORY: Family History  Problem Relation Age of Onset  . Depression Mother   .  Breast cancer Mother   . Throat cancer Father     SOCIAL HISTORY: Social History   Socioeconomic History  . Marital status: Married    Spouse name: Not on file  . Number of children: 0  . Years of education: 61  . Highest education level: Not on file  Occupational History  . Occupation: Therapist, occupational  Tobacco Use  . Smoking status: Never Smoker  . Smokeless tobacco: Never Used  Substance and Sexual Activity  . Alcohol use: Yes    Comment: 2 beers year  . Drug use: No  . Sexual activity: Yes  Other Topics Concern  . Not on file  Social History Narrative   Does shift work   Comptroller Strain:   . Difficulty of Paying Living Expenses:   Food Insecurity:   . Worried About Charity fundraiser in the Last Year:   . Arboriculturist in the Last Year:   Transportation Needs:   . Film/video editor (Medical):   Marland Kitchen Lack of Transportation (Non-Medical):   Physical Activity:   . Days of Exercise per Week:   . Minutes of Exercise per Session:   Stress:   . Feeling of Stress :   Social Connections:   . Frequency of Communication with Friends and Family:   . Frequency of Social Gatherings with Friends and Family:   . Attends Religious Services:   . Active Member of Clubs or Organizations:   . Attends Archivist Meetings:   Marland Kitchen Marital Status:   Intimate Partner Violence:   . Fear of Current or Ex-Partner:   . Emotionally Abused:   Marland Kitchen Physically Abused:   . Sexually Abused:    PHYSICAL EXAM  Vitals:   12/13/19 0756  BP: 122/82  Pulse: (!) 58  Weight: 176 lb (79.8 kg)  Height: 5\' 6"  (1.676 m)   Body mass index is 28.41 kg/m.  Generalized: Well developed, in no acute distress   Neurological examination  Mentation: Alert oriented to time, place, history taking. Follows all commands speech and language fluent Cranial nerve II-XII: Pupils were equal round reactive to light. Extraocular movements were full, visual field were full on confrontational test. Facial sensation and strength were normal. Head turning and shoulder shrug  were normal and symmetric. Motor: The motor testing reveals 5 over 5 strength of all 4 extremities. Good symmetric motor tone is noted throughout.  Sensory: Sensory testing is intact to soft touch on all 4 extremities. No evidence of extinction is noted.  Coordination: Cerebellar testing reveals good finger-nose-finger and heel-to-shin bilaterally.  Gait and station: Gait is normal.  Reflexes: Deep tendon reflexes are symmetric and normal bilaterally.   DIAGNOSTIC DATA (LABS, IMAGING,  TESTING) - I reviewed patient records, labs, notes, testing and imaging myself where available.  Lab Results  Component Value Date   WBC 6.6 12/12/2018   HGB 13.4 12/12/2018   HCT 39.7 12/12/2018   MCV 88 12/12/2018   PLT 279 12/12/2018      Component Value Date/Time   NA 138 12/12/2018 0949   K 4.8 12/12/2018 0949   CL 100 12/12/2018 0949   CO2 23 12/12/2018 0949   GLUCOSE 98 12/12/2018 0949   GLUCOSE 106 (H) 12/10/2012 0215   BUN 20 12/12/2018 0949   CREATININE 1.24 12/12/2018 0949   CALCIUM 9.2 12/12/2018 0949   PROT 6.1 12/12/2018 0949   ALBUMIN 4.0 12/12/2018 0949   AST 22  12/12/2018 0949   ALT 18 12/12/2018 0949   ALKPHOS 90 12/12/2018 0949   BILITOT <0.2 12/12/2018 0949   GFRNONAA 66 12/12/2018 0949   GFRAA 76 12/12/2018 0949   No results found for: CHOL, HDL, LDLCALC, LDLDIRECT, TRIG, CHOLHDL No results found for: HGBA1C No results found for: VITAMINB12 No results found for: TSH  ASSESSMENT AND PLAN 54 y.o. year old male  has a past medical history of Cancer ([place]) (february 1990), Closed left arm fracture, Headache(784.0), History of headache, History of renal calculi, Hypertension, Lumbar herniated disc (11/2015), Restless leg syndrome, and Seizures ([place]). here with:  1.  Seizures  He remains overall stable, seizures well controlled.  He will remain on Dilantin 400 mg daily.  I will check routine blood work.  He will call for recurrent seizure otherwise follow-up 1 year or sooner if needed.  I spent 20 minutes of face-to-face and non-face-to-face time with patient.  This included previsit chart review, lab review, study review, order entry, electronic health record documentation, patient education.  Butler Denmark, AGNP-C, DNP 12/13/2019, 8:26 AM [facility] [address] [place], [place] [address] [phone]

## 2019-12-13 NOTE — Progress Notes (Signed)
I have read the note, and I agree with the clinical assessment and plan.  Kenzie Flakes K Axyl Sitzman   

## 2019-12-14 LAB — COMPREHENSIVE METABOLIC PANEL
ALT: 27 IU/L (ref 0–44)
AST: 38 IU/L (ref 0–40)
Albumin/Globulin Ratio: 1.8 (ref 1.2–2.2)
Albumin: 4.6 g/dL (ref 3.8–4.9)
Alkaline Phosphatase: 112 IU/L (ref 48–121)
BUN/Creatinine Ratio: 26 — ABNORMAL HIGH (ref 9–20)
BUN: 30 mg/dL — ABNORMAL HIGH (ref 6–24)
Bilirubin Total: <0.2 mg/dL (ref 0.0–1.2)
CO2: 26 mmol/L (ref 20–29)
Calcium: 9.4 mg/dL (ref 8.7–10.2)
Chloride: 98 mmol/L (ref 96–106)
Creatinine, Ser: 1.14 mg/dL (ref 0.76–1.27)
GFR calc Af Amer: 84 mL/min/{1.73_m2} (ref >59)
GFR calc non Af Amer: 73 mL/min/{1.73_m2} (ref >59)
Globulin, Total: 2.5 g/dL (ref 1.5–4.5)
Glucose: 83 mg/dL (ref 65–99)
Potassium: 3.9 mmol/L (ref 3.5–5.2)
Sodium: 137 mmol/L (ref 134–144)
Total Protein: 7.1 g/dL (ref 6.0–8.5)

## 2019-12-14 LAB — CBC WITH DIFFERENTIAL/PLATELET
Basophils Absolute: 0.1 10*3/uL (ref 0.0–0.2)
Basos: 1 %
EOS (ABSOLUTE): 0.4 10*3/uL (ref 0.0–0.4)
Eos: 5 %
Hematocrit: 39.8 % (ref 37.5–51.0)
Hemoglobin: 13.2 g/dL (ref 13.0–17.7)
Immature Grans (Abs): 0 10*3/uL (ref 0.0–0.1)
Immature Granulocytes: 0 %
Lymphocytes Absolute: 2.9 10*3/uL (ref 0.7–3.1)
Lymphs: 35 %
MCH: 29.2 pg (ref 26.6–33.0)
MCHC: 33.2 g/dL (ref 31.5–35.7)
MCV: 88 fL (ref 79–97)
Monocytes Absolute: 0.8 10*3/uL (ref 0.1–0.9)
Monocytes: 10 %
Neutrophils Absolute: 4.2 10*3/uL (ref 1.4–7.0)
Neutrophils: 49 %
Platelets: 273 10*3/uL (ref 150–450)
RBC: 4.52 x10E6/uL (ref 4.14–5.80)
RDW: 12.6 % (ref 11.6–15.4)
WBC: 8.5 10*3/uL (ref 3.4–10.8)

## 2019-12-14 LAB — PHENYTOIN LEVEL, TOTAL: Phenytoin (Dilantin), Serum: 12.1 ug/mL (ref 10.0–20.0)

## 2019-12-17 ENCOUNTER — Telehealth: Payer: Self-pay

## 2019-12-17 NOTE — Telephone Encounter (Signed)
-----   Message from Suzzanne Cloud, NP sent at 12/17/2019  8:07 AM EDT ----- Labs are stable, Dilantin level is within therapeutic range.

## 2019-12-17 NOTE — Telephone Encounter (Signed)
Seth Liu is a 54 y.o. male was contacted and notified of the message below. Pt has no additional questions or concerns.

## 2020-12-07 ENCOUNTER — Other Ambulatory Visit: Payer: Self-pay | Admitting: Neurology

## 2020-12-15 ENCOUNTER — Ambulatory Visit: Payer: 59 | Admitting: Neurology

## 2020-12-15 ENCOUNTER — Encounter: Payer: Self-pay | Admitting: Neurology

## 2020-12-15 VITALS — BP 143/91 | HR 66 | Ht 66.0 in | Wt 179.0 lb

## 2020-12-15 DIAGNOSIS — G40909 Epilepsy, unspecified, not intractable, without status epilepticus: Secondary | ICD-10-CM | POA: Diagnosis not present

## 2020-12-15 DIAGNOSIS — G40309 Generalized idiopathic epilepsy and epileptic syndromes, not intractable, without status epilepticus: Secondary | ICD-10-CM | POA: Diagnosis not present

## 2020-12-15 NOTE — Progress Notes (Signed)
I have read the note, and I agree with the clinical assessment and plan.  Terry Bolotin K Kevante Lunt   

## 2020-12-15 NOTE — Progress Notes (Addendum)
PATIENT: Seth Liu DOB: 1966/03/02  REASON FOR VISIT: follow up for seizure HISTORY FROM: patient  Primary Neurologist: Dr. Jannifer Franklin   HISTORY OF PRESENT ILLNESS: Today 12/15/20  Seth Liu is a 55 year old male with history of seizure disorder.  Remains on Dilantin 400 mg daily, takes at 3 PM (works days and nights, this works best for his schedule).  Dilantin level was 12.1 in July 2021. Last seizure was in 2014, missed dose, sleep deprived. Tolerating it well. Still under stress, got divorced, is now remarried.  Works full time. No major health issues. Here today alone. BP 143/91, ran out of BP medication. Still going through stress with his ex-wife. Living in [place] now.   Update 12/13/2019 SS: Seth Liu is a 55 year old male with history of seizure disorder.  Last seizure was in 2014 when he missed a dose of his Dilantin and he was sleep deprived.  Is currently taking Dilantin 400 mg daily, previously level has been low at 7.0, his dose was increased to 500 mg daily, he has stayed on the 400 mg daily dose.  Last year Dilantin level was 8.2.  Wants to stay at current dose, with higher doses reports is forgetful, has dry mouth, create more problem for him.  He continues to work full-time at [employer] and [employer].  He is going through divorce.  Denies any new problems or concerns.  Seems to be tolerating Dilantin well.  Presents today for evaluation unaccompanied.  HISTORY 12/12/2018 SS: Seth Liu.  Is a 55 year old male with history of seizure disorder.  His last seizure occurred in 2014.  At last visit his Dilantin level was low at 7.0, his dose was increased to 5 capsules daily (500 mg total).  He was supposed to come back for a trough level, but did not. His last seizure occurred in 2014. He says when he had seizure in 2014, he had missed a dose of his dilantin and he was sleep depreived. He has not had recurrent seizure. 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 He takes his Dilantin in the  afternoon, he takes 400 mg daily. He is tolerating the medication well. He denies any new problems or concerns. He does see his primary care regularly. He says he is getting a divorce. He remains on calcium and vitamin D supplementation. He presents for follow-up unaccompanied. He says he is very consistent taking his medication and getting adequate rest daily.    REVIEW OF SYSTEMS: Out of a complete 14 system review of symptoms, the patient complains only of the following symptoms, and all other reviewed systems are negative.  N/A  ALLERGIES: No Known Allergies  HOME MEDICATIONS: Outpatient Medications Prior to Visit  Medication Sig Dispense Refill   Calcium Carbonate-Vitamin D (CALCIUM + D PO) Take 1 tablet by mouth daily.     dexlansoprazole (DEXILANT) 60 MG capsule Take 60 mg by mouth daily as needed (Acid reflux).      lisinopril (PRINIVIL,ZESTRIL) 20 MG tablet Take 20 mg by mouth daily.     Multiple Vitamins-Minerals (MULTIVITAMIN PO) Take 1 tablet by mouth daily.     phenytoin (DILANTIN) 100 MG ER capsule TAKE 4 CAPSULES BY MOUTH ONCE DAILY 360 capsule 3   bisoprolol-hydrochlorothiazide (ZIAC) 10-6.25 MG per tablet Take 1 tablet by mouth daily.     No facility-administered medications prior to visit.    PAST MEDICAL HISTORY: Past Medical History:  Diagnosis Date   Cancer [facility]) february 1990   melanoma stage 3  left shoulder blade   Closed left arm fracture    Headache(784.0)    History of headache    History of renal calculi    Hypertension    Lumbar herniated disc 11/2015   L4-5, receiving epidural steroid injections   Restless leg syndrome    Seizures (HCC)     PAST SURGICAL HISTORY: Past Surgical History:  Procedure Laterality Date   FRACTURE SURGERY     SHOULDER SURGERY     Melanoma rexected    FAMILY HISTORY: Family History  Problem Relation Age of Onset   Depression Mother    Breast cancer Mother    Throat cancer Father     SOCIAL HISTORY: Social  History   Socioeconomic History   Marital status: Married    Spouse name: Not on file   Number of children: 0   Years of education: 12   Highest education level: Not on file  Occupational History   Occupation: Therapist, occupational  Tobacco Use   Smoking status: Never   Smokeless tobacco: Never  Substance and Sexual Activity   Alcohol use: Yes    Comment: 2 beers year   Drug use: No   Sexual activity: Yes  Other Topics Concern   Not on file  Social History Narrative   Does shift work   Investment banker, operational of Radio broadcast assistant Strain: Not on file  Food Insecurity: Not on file  Transportation Needs: Not on file  Physical Activity: Not on file  Stress: Not on file  Social Connections: Not on file  Intimate Partner Violence: Not on file   PHYSICAL EXAM  Vitals:   12/15/20 0812  BP: (!) 143/91  Pulse: 66  Weight: 179 lb (81.2 kg)  Height: '5\' 6"'$  (1.676 m)   Body mass index is 28.89 kg/m.  Generalized: Well developed, in no acute distress   Neurological examination  Mentation: Alert oriented to time, place, history taking. Follows all commands speech and language fluent Cranial nerve II-XII: Pupils were equal round reactive to light. Extraocular movements were full, visual field were full on confrontational test. Facial sensation and strength were normal. Head turning and shoulder shrug  were normal and symmetric. Motor: The motor testing reveals 5 over 5 strength of all 4 extremities. Good symmetric motor tone is noted throughout.  Sensory: Sensory testing is intact to soft touch on all 4 extremities. No evidence of extinction is noted.  Coordination: Cerebellar testing reveals good finger-nose-finger and heel-to-shin bilaterally.  Gait and station: Gait is normal. Tandem gait is normal.  Reflexes: Deep tendon reflexes are symmetric and normal bilaterally.   DIAGNOSTIC DATA (LABS, IMAGING, TESTING) - I reviewed patient records, labs, notes, testing and  imaging myself where available.  Lab Results  Component Value Date   WBC 8.5 12/13/2019   HGB 13.2 12/13/2019   HCT 39.8 12/13/2019   MCV 88 12/13/2019   PLT 273 12/13/2019      Component Value Date/Time   NA 137 12/13/2019 0827   K 3.9 12/13/2019 0827   CL 98 12/13/2019 0827   CO2 26 12/13/2019 0827   GLUCOSE 83 12/13/2019 0827   GLUCOSE 106 (H) 12/10/2012 0215   BUN 30 (H) 12/13/2019 0827   CREATININE 1.14 12/13/2019 0827   CALCIUM 9.4 12/13/2019 0827   PROT 7.1 12/13/2019 0827   ALBUMIN 4.6 12/13/2019 0827   AST 38 12/13/2019 0827   ALT 27 12/13/2019 0827   ALKPHOS 112 12/13/2019 0827   BILITOT <0.2 12/13/2019 0827  GFRNONAA 73 12/13/2019 0827   GFRAA 84 12/13/2019 0827   No results found for: CHOL, HDL, LDLCALC, LDLDIRECT, TRIG, CHOLHDL No results found for: HGBA1C No results found for: VITAMINB12 No results found for: TSH  ASSESSMENT AND PLAN 54 y.o. year old male  has a past medical history of Cancer ([place]) (february 1990), Closed left arm fracture, Headache(784.0), History of headache, History of renal calculi, Hypertension, Lumbar herniated disc (11/2015), Restless leg syndrome, and Seizures ([place]). here with:  1.  Seizures  -Doing well, last seizure was in 2014 -Check labs today  -Continue Dilantin at current dosing, 400 mg daily -Call for seizures, see you back in 1 year or sooner if needed -Since Dr. Jannifer Franklin retiring, will have Dr. Leta Baptist as primary neurologist   Butler Denmark, AGNP-C, [place] 12/15/2020, 8:17 AM [facility] [address] [place], [place] [address] [phone]

## 2020-12-15 NOTE — Patient Instructions (Signed)
Continue Dilantin  Check labs today  See you back in 1 year Call for any seizures

## 2020-12-16 ENCOUNTER — Telehealth: Payer: Self-pay | Admitting: *Deleted

## 2020-12-16 ENCOUNTER — Encounter: Payer: Self-pay | Admitting: *Deleted

## 2020-12-16 LAB — CBC WITH DIFFERENTIAL/PLATELET
Basophils Absolute: 0.1 10*3/uL (ref 0.0–0.2)
Basos: 1 %
EOS (ABSOLUTE): 0.7 10*3/uL — ABNORMAL HIGH (ref 0.0–0.4)
Eos: 9 %
Hematocrit: 37.2 % — ABNORMAL LOW (ref 37.5–51.0)
Hemoglobin: 12.4 g/dL — ABNORMAL LOW (ref 13.0–17.7)
Immature Grans (Abs): 0 10*3/uL (ref 0.0–0.1)
Immature Granulocytes: 0 %
Lymphocytes Absolute: 2.5 10*3/uL (ref 0.7–3.1)
Lymphs: 34 %
MCH: 28.8 pg (ref 26.6–33.0)
MCHC: 33.3 g/dL (ref 31.5–35.7)
MCV: 87 fL (ref 79–97)
Monocytes Absolute: 0.7 10*3/uL (ref 0.1–0.9)
Monocytes: 9 %
Neutrophils Absolute: 3.4 10*3/uL (ref 1.4–7.0)
Neutrophils: 47 %
Platelets: 260 10*3/uL (ref 150–450)
RBC: 4.3 x10E6/uL (ref 4.14–5.80)
RDW: 11.7 % (ref 11.6–15.4)
WBC: 7.3 10*3/uL (ref 3.4–10.8)

## 2020-12-16 LAB — COMPREHENSIVE METABOLIC PANEL
ALT: 13 IU/L (ref 0–44)
AST: 15 IU/L (ref 0–40)
Albumin/Globulin Ratio: 2 (ref 1.2–2.2)
Albumin: 4.2 g/dL (ref 3.8–4.9)
Alkaline Phosphatase: 107 IU/L (ref 44–121)
BUN/Creatinine Ratio: 14 (ref 9–20)
BUN: 14 mg/dL (ref 6–24)
Bilirubin Total: <0.2 mg/dL (ref 0.0–1.2)
CO2: 21 mmol/L (ref 20–29)
Calcium: 8.9 mg/dL (ref 8.7–10.2)
Chloride: 104 mmol/L (ref 96–106)
Creatinine, Ser: 1.01 mg/dL (ref 0.76–1.27)
Globulin, Total: 2.1 g/dL (ref 1.5–4.5)
Glucose: 115 mg/dL — ABNORMAL HIGH (ref 65–99)
Potassium: 3.6 mmol/L (ref 3.5–5.2)
Sodium: 140 mmol/L (ref 134–144)
Total Protein: 6.3 g/dL (ref 6.0–8.5)
eGFR: 88 mL/min/{1.73_m2} (ref >59)

## 2020-12-16 LAB — PHENYTOIN LEVEL, TOTAL: Phenytoin (Dilantin), Serum: 10.4 ug/mL (ref 10.0–20.0)

## 2020-12-16 NOTE — Telephone Encounter (Signed)
I called patient and left a detailed message on voicemail (ok per DPR). I also sent a mychart message.

## 2020-12-16 NOTE — Telephone Encounter (Signed)
-----   Message from Suzzanne Cloud, NP sent at 12/16/2020  9:59 AM EDT ----- Please call, continue with Dilantin, labs show no significant abnormalities, HGB very mildly low at 12.4, Dilantin level is within range at 10.4, he should call for any seizure like feeling or episode.

## 2021-12-10 ENCOUNTER — Telehealth: Payer: Self-pay

## 2021-12-10 NOTE — Telephone Encounter (Signed)
We received the patients DMV paperwork for completion. States that he would like to pick up completed paperwork and pay $50 form fee at his yearly appointment on 7/25. Bringing to POD 2 for completion

## 2021-12-14 NOTE — Progress Notes (Unsigned)
PATIENT: Seth Liu DOB: [birth year]  REASON FOR VISIT: follow up for seizure HISTORY FROM: patient  Primary Neurologist: Dr. Susann Givens   HISTORY OF PRESENT ILLNESS: Today 12/15/21  Seth Liu is here today for follow-up.  Remains on Dilantin.  July 2022 Dilantin level was 10.4, CBC CMP showed no significant abnormalities. No seizures, living in [place]. No changes to health. Got a new puppy. Continues with Dilantin 400 mg in mid-afternoon. On day shift. Tapered off Dilantin in 1996 with prior neurologist had seizure. Has DMV paperwork to complete from [place]. On calcium/vitamin D.  Update 12/15/20 SS: Seth Liu is a 56 year old male with history of seizure disorder.  Remains on Dilantin 400 mg daily, takes at 3 PM (works days and nights, this works best for his schedule).  Dilantin level was 12.1 in July 2021. Last seizure was in 2014, missed dose, sleep deprived. Tolerating it well. Still under stress, got divorced, is now remarried.  Works full time. No major health issues. Here today alone. BP 143/91, ran out of BP medication. Still going through stress with his ex-wife. Living in [place] now.   Update 12/13/2019 SS: Seth Liu is a 56 year old male with history of seizure disorder.  Last seizure was in 2014 when he missed a dose of his Dilantin and he was sleep deprived.  Is currently taking Dilantin 400 mg daily, previously level has been low at 7.0, his dose was increased to 500 mg daily, he has stayed on the 400 mg daily dose.  Last year Dilantin level was 8.2.  Wants to stay at current dose, with higher doses reports is forgetful, has dry mouth, create more problem for him.  He continues to work full-time at [employer] and [employer].  He is going through divorce.  Denies any new problems or concerns.  Seems to be tolerating Dilantin well.  Presents today for evaluation unaccompanied.  HISTORY 12/12/2018 SS: Seth Liu.  Is a 56 year old male with history of seizure disorder.  His last seizure occurred in 2014.   At last visit his Dilantin level was low at 7.0, his dose was increased to 5 capsules daily (500 mg total).  He was supposed to come back for a trough level, but did not. His last seizure occurred in 2014. He says when he had seizure in 2014, he had missed a dose of his dilantin and he was sleep depreived. He has not had recurrent seizure. 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 He takes his Dilantin in the afternoon, he takes 400 mg daily. He is tolerating the medication well. He denies any new problems or concerns. He does see his primary care regularly. He says he is getting a divorce. He remains on calcium and vitamin D supplementation. He presents for follow-up unaccompanied. He says he is very consistent taking his medication and getting adequate rest daily.    REVIEW OF SYSTEMS: Out of a complete 14 system review of symptoms, the patient complains only of the following symptoms, and all other reviewed systems are negative.  See HPI  ALLERGIES: No Known Allergies  HOME MEDICATIONS: Outpatient Medications Prior to Visit  Medication Sig Dispense Refill   Calcium Carbonate-Vitamin D (CALCIUM + D PO) Take 1 tablet by mouth daily.     lansoprazole (PREVACID) 15 MG capsule Take 15 mg by mouth daily at 12 noon.     lisinopril (PRINIVIL,ZESTRIL) 20 MG tablet Take 20 mg by mouth daily.     Multiple Vitamins-Minerals (MULTIVITAMIN PO) Take 1 tablet by  mouth daily.     phenytoin (DILANTIN) 100 MG ER capsule TAKE 4 CAPSULES BY MOUTH ONCE DAILY 360 capsule 3   dexlansoprazole (DEXILANT) 60 MG capsule Take 60 mg by mouth daily as needed (Acid reflux).      No facility-administered medications prior to visit.    PAST MEDICAL HISTORY: Past Medical History:  Diagnosis Date   Cancer [facility]) february 1990   melanoma stage 3 left shoulder blade   Closed left arm fracture    Headache(784.0)    History of headache    History of renal calculi    Hypertension    Lumbar herniated disc 11/2015    L4-5, receiving epidural steroid injections   Restless leg syndrome    Seizures (HCC)     PAST SURGICAL HISTORY: Past Surgical History:  Procedure Laterality Date   FRACTURE SURGERY     SHOULDER SURGERY     Melanoma rexected    FAMILY HISTORY: Family History  Problem Relation Age of Onset   Depression Mother    Breast cancer Mother    Throat cancer Father     SOCIAL HISTORY: Social History   Socioeconomic History   Marital status: Married    Spouse name: Not on file   Number of children: 0   Years of education: 12   Highest education level: Not on file  Occupational History   Occupation: Therapist, occupational  Tobacco Use   Smoking status: Never   Smokeless tobacco: Never  Substance and Sexual Activity   Alcohol use: Yes    Comment: 2 beers year   Drug use: No   Sexual activity: Yes  Other Topics Concern   Not on file  Social History Narrative   Does shift work   Investment banker, operational of Radio broadcast assistant Strain: Not on file  Food Insecurity: Not on file  Transportation Needs: Not on file  Physical Activity: Not on file  Stress: Not on file  Social Connections: Not on file  Intimate Partner Violence: Not on file   PHYSICAL EXAM  Vitals:   12/15/21 0823  BP: 130/82  Pulse: 78  Weight: 175 lb (79.4 kg)  Height: '5\' 6"'$  (1.676 m)    Body mass index is 28.25 kg/m.  Generalized: Well developed, in no acute distress   Neurological examination  Mentation: Alert oriented to time, place, history taking. Follows all commands speech and language fluent Cranial nerve II-XII: Pupils were equal round reactive to light. Extraocular movements were full, visual field were full on confrontational test. Facial sensation and strength were normal. Head turning and shoulder shrug  were normal and symmetric. Motor: The motor testing reveals 5 over 5 strength of all 4 extremities. Good symmetric motor tone is noted throughout.  Sensory: Sensory testing is  intact to soft touch on all 4 extremities. No evidence of extinction is noted.  Coordination: Cerebellar testing reveals good finger-nose-finger and heel-to-shin bilaterally.  Gait and station: Gait is normal. Tandem gait is normal.  Reflexes: Deep tendon reflexes are symmetric and normal bilaterally.   DIAGNOSTIC DATA (LABS, IMAGING, TESTING) - I reviewed patient records, labs, notes, testing and imaging myself where available.  Lab Results  Component Value Date   WBC 7.3 12/15/2020   HGB 12.4 (L) 12/15/2020   HCT 37.2 (L) 12/15/2020   MCV 87 12/15/2020   PLT 260 12/15/2020      Component Value Date/Time   NA 140 12/15/2020 0846   K 3.6 12/15/2020 0846   CL 104 12/15/2020  0846   CO2 21 12/15/2020 0846   GLUCOSE 115 (H) 12/15/2020 0846   GLUCOSE 106 (H) 12/10/2012 0215   BUN 14 12/15/2020 0846   CREATININE 1.01 12/15/2020 0846   CALCIUM 8.9 12/15/2020 0846   PROT 6.3 12/15/2020 0846   ALBUMIN 4.2 12/15/2020 0846   AST 15 12/15/2020 0846   ALT 13 12/15/2020 0846   ALKPHOS 107 12/15/2020 0846   BILITOT <0.2 12/15/2020 0846   GFRNONAA 73 12/13/2019 0827   GFRAA 84 12/13/2019 0827   No results found for: "CHOL", "HDL", "LDLCALC", "LDLDIRECT", "TRIG", "CHOLHDL" No results found for: "HGBA1C" No results found for: "VITAMINB12" No results found for: "TSH"  ASSESSMENT AND PLAN 56 y.o. year old male  has a past medical history of Cancer ([place]) (february 1990), Closed left arm fracture, Headache(784.0), History of headache, History of renal calculi, Hypertension, Lumbar herniated disc (11/2015), Restless leg syndrome, and Seizures ([place]). here with:  1.  Seizures  -Last seizure was in 2014, generalized with loss of consciousness  -Continue Dilantin 400 mg daily -Check routine labs today -Has been on Dilantin long-term, has had a seizure in the past when he tapered off, we discussed newer AED, would like to see Dr .April Manson at next visit to establish care in 1 year -Will complete  DMV papers once labs return  Evangeline Dakin, [place] 12/15/2021, 8:48 AM [facility] [address], [place], [place] [address] [phone]

## 2021-12-15 ENCOUNTER — Encounter: Payer: Self-pay | Admitting: Neurology

## 2021-12-15 ENCOUNTER — Ambulatory Visit (INDEPENDENT_AMBULATORY_CARE_PROVIDER_SITE_OTHER): Payer: 59 | Admitting: Neurology

## 2021-12-15 VITALS — BP 130/82 | HR 78 | Ht 66.0 in | Wt 175.0 lb

## 2021-12-15 DIAGNOSIS — G40909 Epilepsy, unspecified, not intractable, without status epilepticus: Secondary | ICD-10-CM

## 2021-12-15 DIAGNOSIS — G40309 Generalized idiopathic epilepsy and epileptic syndromes, not intractable, without status epilepticus: Secondary | ICD-10-CM | POA: Diagnosis not present

## 2021-12-15 MED ORDER — PHENYTOIN SODIUM EXTENDED 100 MG PO CAPS: ORAL_CAPSULE | ORAL | 3 refills | Status: DC

## 2021-12-15 NOTE — Patient Instructions (Signed)
Meds ordered this encounter  Medications   phenytoin (DILANTIN) 100 MG ER capsule    Sig: Take 4 capsules by mouth once daily    Dispense:  360 capsule    Refill:  3

## 2021-12-16 LAB — COMPREHENSIVE METABOLIC PANEL
ALT: 18 IU/L (ref 0–44)
AST: 18 IU/L (ref 0–40)
Albumin/Globulin Ratio: 1.9 (ref 1.2–2.2)
Albumin: 4.3 g/dL (ref 3.8–4.9)
Alkaline Phosphatase: 118 IU/L (ref 44–121)
BUN/Creatinine Ratio: 16 (ref 9–20)
BUN: 19 mg/dL (ref 6–24)
Bilirubin Total: <0.2 mg/dL (ref 0.0–1.2)
CO2: 22 mmol/L (ref 20–29)
Calcium: 9.6 mg/dL (ref 8.7–10.2)
Chloride: 103 mmol/L (ref 96–106)
Creatinine, Ser: 1.16 mg/dL (ref 0.76–1.27)
Globulin, Total: 2.3 g/dL (ref 1.5–4.5)
Glucose: 103 mg/dL — ABNORMAL HIGH (ref 70–99)
Potassium: 4.9 mmol/L (ref 3.5–5.2)
Sodium: 139 mmol/L (ref 134–144)
Total Protein: 6.6 g/dL (ref 6.0–8.5)
eGFR: 74 mL/min/{1.73_m2} (ref >59)

## 2021-12-16 LAB — CBC WITH DIFFERENTIAL/PLATELET
Basophils Absolute: 0.1 10*3/uL (ref 0.0–0.2)
Basos: 1 %
EOS (ABSOLUTE): 0.4 10*3/uL (ref 0.0–0.4)
Eos: 5 %
Hematocrit: 40.9 % (ref 37.5–51.0)
Hemoglobin: 13.5 g/dL (ref 13.0–17.7)
Immature Grans (Abs): 0 10*3/uL (ref 0.0–0.1)
Immature Granulocytes: 0 %
Lymphocytes Absolute: 2.1 10*3/uL (ref 0.7–3.1)
Lymphs: 28 %
MCH: 28.4 pg (ref 26.6–33.0)
MCHC: 33 g/dL (ref 31.5–35.7)
MCV: 86 fL (ref 79–97)
Monocytes Absolute: 0.7 10*3/uL (ref 0.1–0.9)
Monocytes: 9 %
Neutrophils Absolute: 4.4 10*3/uL (ref 1.4–7.0)
Neutrophils: 57 %
Platelets: 282 10*3/uL (ref 150–450)
RBC: 4.76 x10E6/uL (ref 4.14–5.80)
RDW: 13.2 % (ref 11.6–15.4)
WBC: 7.6 10*3/uL (ref 3.4–10.8)

## 2021-12-16 LAB — PHENYTOIN LEVEL, TOTAL: Phenytoin (Dilantin), Serum: 7.8 ug/mL — ABNORMAL LOW (ref 10.0–20.0)

## 2022-06-10 ENCOUNTER — Telehealth: Payer: Self-pay | Admitting: Neurology

## 2022-06-10 NOTE — Telephone Encounter (Signed)
LVM and sent mychart msg informing pt of need to reschedule 12/20/22 appointment - MD out

## 2022-12-20 ENCOUNTER — Other Ambulatory Visit: Payer: Self-pay | Admitting: Neurology

## 2022-12-20 ENCOUNTER — Ambulatory Visit: Payer: 59 | Admitting: Neurology

## 2022-12-20 NOTE — Telephone Encounter (Signed)
Pt called wanting to know when this will be called in for him. Please advise.

## 2022-12-22 ENCOUNTER — Telehealth: Payer: Self-pay | Admitting: Neurology

## 2022-12-22 MED ORDER — PHENYTOIN SODIUM EXTENDED 100 MG PO CAPS: ORAL_CAPSULE | ORAL | 3 refills | Status: DC

## 2022-12-22 NOTE — Telephone Encounter (Signed)
Pt is requesting a refill for phenytoin (DILANTIN) 100 MG ER capsule.  Pharmacy: [facility] [phone]

## 2022-12-22 NOTE — Telephone Encounter (Signed)
Requested Prescriptions   Pending Prescriptions Disp Refills   phenytoin (DILANTIN) 100 MG ER capsule 360 capsule 3    Sig: Take 4 capsules by mouth once daily   Pt last seen 12/15/21, next appt scheduled 01/17/23

## 2023-01-17 ENCOUNTER — Ambulatory Visit (INDEPENDENT_AMBULATORY_CARE_PROVIDER_SITE_OTHER): Payer: 59 | Admitting: Neurology

## 2023-01-17 DIAGNOSIS — G40309 Generalized idiopathic epilepsy and epileptic syndromes, not intractable, without status epilepticus: Secondary | ICD-10-CM

## 2023-01-17 DIAGNOSIS — G40909 Epilepsy, unspecified, not intractable, without status epilepticus: Secondary | ICD-10-CM

## 2023-01-18 ENCOUNTER — Encounter: Payer: Self-pay | Admitting: Neurology

## 2023-01-18 NOTE — Progress Notes (Signed)
PATIENT: Seth Liu DOB: [birth year]  REASON FOR VISIT: follow up for seizure HISTORY FROM: patient  Primary Neurologist: Dr. Boyce Medici   HISTORY OF PRESENT ILLNESS: Today 01/18/23 patient presents today for follow-up, last visit was a year ago and since then he has been doing well on Dilantin 400 mg daily, denies any seizure or seizure like activity, denies any side effect from the medication.  Reports his last seizure was in 2014 in the setting of sleep deprivation and medication non adherence.  He is reluctant to wean Dilantin since he did have a breakthrough seizure while weaning Dilantin and 1996.   Update 7/25/202: Seth Liu is here today for follow-up.  Remains on Dilantin.  July 2022 Dilantin level was 10.4, CBC CMP showed no significant abnormalities. No seizures, living in [place]. No changes to health. Got a new puppy. Continues with Dilantin 400 mg in mid-afternoon. On day shift. Tapered off Dilantin in 1996 with prior neurologist had seizure. Has DMV paperwork to complete from [place]. On calcium/vitamin D.  Update 12/15/20 SS: Seth Liu is a 57 year old male with history of seizure disorder.  Remains on Dilantin 400 mg daily, takes at 3 PM (works days and nights, this works best for his schedule).  Dilantin level was 12.1 in July 2021. Last seizure was in 2014, missed dose, sleep deprived. Tolerating it well. Still under stress, got divorced, is now remarried.  Works full time. No major health issues. Here today alone. BP 143/91, ran out of BP medication. Still going through stress with his ex-wife. Living in [place] now.   Update 12/13/2019 SS: Seth Liu is a 57 year old male with history of seizure disorder.  Last seizure was in 2014 when he missed a dose of his Dilantin and he was sleep deprived.  Is currently taking Dilantin 400 mg daily, previously level has been low at 7.0, his dose was increased to 500 mg daily, he has stayed on the 400 mg daily dose.  Last year Dilantin level was 8.2.   Wants to stay at current dose, with higher doses reports is forgetful, has dry mouth, create more problem for him.  He continues to work full-time at [employer] and [employer].  He is going through divorce.  Denies any new problems or concerns.  Seems to be tolerating Dilantin well.  Presents today for evaluation unaccompanied.  HISTORY 12/12/2018 SS: Seth Liu.  Is a 57 year old male with history of seizure disorder.  His last seizure occurred in 2014.  At last visit his Dilantin level was low at 7.0, his dose was increased to 5 capsules daily (500 mg total).  He was supposed to come back for a trough level, but did not. His last seizure occurred in 2014. He says when he had seizure in 2014, he had missed a dose of his dilantin and he was sleep depreived. He has not had recurrent seizure. He works Artist shifts at [employer]. He takes his Dilantin in the afternoon, he takes 400 mg daily. He is tolerating the medication well. He denies any new problems or concerns. He does see his primary care regularly. He says he is getting a divorce. He remains on calcium and vitamin D supplementation. He presents for follow-up unaccompanied. He says he is very consistent taking his medication and getting adequate rest daily.    REVIEW OF SYSTEMS: Out of a complete 14 system review of symptoms, the patient complains only of the following symptoms, and all other reviewed systems are negative.  See HPI  ALLERGIES: No Known Allergies  HOME MEDICATIONS: Outpatient Medications Prior to Visit  Medication Sig Dispense Refill   Calcium Carbonate-Vitamin D (CALCIUM + D PO) Take 1 tablet by mouth daily.     lansoprazole (PREVACID) 15 MG capsule Take 15 mg by mouth daily at 12 noon.     lisinopril (PRINIVIL,ZESTRIL) 20 MG tablet Take 20 mg by mouth daily.     Multiple Vitamins-Minerals (MULTIVITAMIN PO) Take 1 tablet by mouth daily.     phenytoin (DILANTIN) 100 MG ER capsule Take 4 capsules by mouth once daily 360  capsule 3   No facility-administered medications prior to visit.    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/2015   L4-5, receiving epidural steroid injections   Restless leg syndrome    Seizures (HCC)     PAST SURGICAL HISTORY: Past Surgical History:  Procedure Laterality Date   FRACTURE SURGERY     SHOULDER SURGERY     Melanoma rexected    FAMILY HISTORY: Family History  Problem Relation Age of Onset   Depression Mother    Breast cancer Mother    Throat cancer Father     SOCIAL HISTORY: Social History   Socioeconomic History   Marital status: Married    Spouse name: Not on file   Number of children: 0   Years of education: 12   Highest education level: Not on file  Occupational History   Occupation: Engineer, building services  Tobacco Use   Smoking status: Never   Smokeless tobacco: Never  Substance and Sexual Activity   Alcohol use: Yes    Comment: 2 beers year   Drug use: No   Sexual activity: Yes  Other Topics Concern   Not on file  Social History Narrative   Does shift work   International aid/development worker of [employer] Strain: Not on file  Food Insecurity: Not on file  Transportation Needs: Not on file  Physical Activity: Not on file  Stress: Not on file  Social Connections: Not on file  Intimate Partner Violence: Not on file   PHYSICAL EXAM  There were no vitals filed for this visit.   There is no height or weight on file to calculate BMI.  Generalized: Well developed, in no acute distress   Neurological examination  Mentation: Alert oriented to time, place, history taking. Follows all commands speech and language fluent Cranial nerve II-XII: Pupils were equal round reactive to light. Extraocular  movements were full, visual field were full on confrontational test. Facial sensation and strength were normal. Head turning and shoulder shrug  were normal and symmetric. Motor: The motor testing reveals 5 over 5 strength of all 4 extremities. Good symmetric motor tone is noted throughout.  Sensory: Sensory testing is intact to soft touch on all 4 extremities. No evidence of extinction is noted.  Coordination: Cerebellar testing reveals good finger-nose-finger and heel-to-shin bilaterally.  Gait and station: Gait is normal. Tandem gait is normal.  Reflexes: Deep tendon reflexes are symmetric and normal bilaterally.   DIAGNOSTIC DATA (LABS, IMAGING, TESTING) - I reviewed patient records, labs, notes, testing and imaging myself where available.  Lab Results  Component Value Date   WBC 7.6 12/15/2021  HGB 13.5 12/15/2021   HCT 40.9 12/15/2021   MCV 86 12/15/2021   PLT 282 12/15/2021      Component Value Date/Time   NA 139 12/15/2021 0949   K 4.9 12/15/2021 0949   CL 103 12/15/2021 0949   CO2 22 12/15/2021 0949   GLUCOSE 103 (H) 12/15/2021 0949   GLUCOSE 106 (H) 12/10/2012 0215   BUN 19 12/15/2021 0949   CREATININE 1.16 12/15/2021 0949   CALCIUM 9.6 12/15/2021 0949   PROT 6.6 12/15/2021 0949   ALBUMIN 4.3 12/15/2021 0949   AST 18 12/15/2021 0949   ALT 18 12/15/2021 0949   ALKPHOS 118 12/15/2021 0949   BILITOT <0.2 12/15/2021 0949   GFRNONAA 73 12/13/2019 0827   GFRAA 84 12/13/2019 0827   No results found for: "CHOL", "HDL", "LDLCALC", "LDLDIRECT", "TRIG", "CHOLHDL" No results found for: "HGBA1C" No results found for: "VITAMINB12" No results found for: "TSH"  ASSESSMENT AND PLAN 57 y.o. year old male  has a past medical history of Cancer (HCC) (february 1990), Closed left arm fracture, Headache(784.0), History of headache, History of renal calculi, Hypertension, Lumbar herniated disc (11/2015), Restless leg syndrome, and Seizures (HCC). here with:  1.  Seizures  -Last  seizure was in 2014, generalized with loss of consciousness  -Continue Dilantin 400 mg daily -Has been on Dilantin long-term, has had a seizure in the past when he tapered off. Reluctant to discontinue Dilantin since he did have a breakthrough seizure while attempting to wean Dilantin back in 1996 - Will contact us when he is ready to discontinue to medication or switch to a different agent.  -Follow up in a year with Maralyn Sago.     Windell Norfolk, MD  01/18/2023, 9:19 AM [facility] [address] [place], [place] [address] [phone]

## 2023-10-31 ENCOUNTER — Telehealth: Payer: Self-pay | Admitting: Neurology

## 2023-10-31 NOTE — Telephone Encounter (Signed)
 Pt dropped of DMV paperwork - gave to [employer] - Paid fee

## 2023-10-31 NOTE — Telephone Encounter (Signed)
 error

## 2023-11-01 ENCOUNTER — Telehealth: Payer: Self-pay | Admitting: *Deleted

## 2023-11-01 DIAGNOSIS — Z0289 Encounter for other administrative examinations: Secondary | ICD-10-CM

## 2023-11-01 NOTE — Telephone Encounter (Signed)
 Pt DMV form faxed on 11/01/2023

## 2023-12-19 ENCOUNTER — Other Ambulatory Visit: Payer: Self-pay | Admitting: Neurology

## 2024-01-17 ENCOUNTER — Ambulatory Visit: Payer: 59 | Admitting: Neurology
